# Patient Record
Sex: Male | Born: 1960 | Marital: Married | Smoking: Former smoker
Health system: Northeastern US, Community
[De-identification: ages and names within clinical notes are randomized; demographics above are authoritative.]

## PROBLEM LIST (undated history)

## (undated) DIAGNOSIS — F524 Premature ejaculation: Secondary | ICD-10-CM

## (undated) DIAGNOSIS — M25569 Pain in unspecified knee: Secondary | ICD-10-CM

## (undated) DIAGNOSIS — I119 Hypertensive heart disease without heart failure: Secondary | ICD-10-CM

## (undated) HISTORY — DX: Pain in unspecified knee: M25.569

## (undated) HISTORY — DX: Premature ejaculation: F52.4

## (undated) HISTORY — DX: Hypertensive heart disease without heart failure: I11.9

---

## 2004-03-02 ENCOUNTER — Encounter: Payer: Self-pay | Admitting: Internal Medicine

## 2004-03-12 ENCOUNTER — Ambulatory Visit: Payer: Self-pay | Admitting: Internal Medicine

## 2004-03-12 ENCOUNTER — Other Ambulatory Visit: Payer: Self-pay

## 2004-03-12 DIAGNOSIS — D1620 Benign neoplasm of long bones of unspecified lower limb: Principal | ICD-10-CM

## 2004-03-12 DIAGNOSIS — M25469 Effusion, unspecified knee: Secondary | ICD-10-CM

## 2004-03-12 LAB — BASIC METABOLIC PANEL
BUN (UREA NITROGEN): 23 mg/dl — ABNORMAL HIGH (ref 10–20)
CALCIUM: 9.1 mg/dl (ref 8.5–10.5)
CARBON DIOXIDE: 29 mEQ/L (ref 22–32)
CHLORIDE: 110 mEQ/L (ref 98–110)
CREATININE: 1 mg/dl (ref 0.8–1.4)
Glucose Random: 91 mg/dl (ref 65–160)
POTASSIUM: 3.8 mEQ/L (ref 3.5–5.0)
SODIUM: 141 mEQ/L (ref 135–145)

## 2004-03-12 LAB — BLOOD COUNT COMPLETE AUTOMATED
HEMATOCRIT: 44.4 % (ref 42.0–52.0)
HEMOGLOBIN: 15.4 g/dL (ref 14.0–18.0)
MEAN CORP HGB CONC: 34.8 g/dL (ref 32.0–36.0)
MEAN CORPUSCULAR HGB: 30.6 pg (ref 27.0–31.0)
MEAN CORPUSCULAR VOL: 88 fL (ref 80.0–94.0)
MEAN PLATELET VOLUME: 8.6 fL (ref 6.4–10.8)
PLATELET COUNT: 223 10*3/uL (ref 150–400)
RBC DISTRIBUTION WIDTH: 13.1 % (ref 11.5–14.3)
RED BLOOD CELL COUNT: 5.05 MIL/uL (ref 4.70–6.10)
WHITE BLOOD CELL COUNT: 4.2 10*3/uL — ABNORMAL LOW (ref 4.8–10.8)

## 2004-03-12 LAB — URINALYSIS
BACTERIA: <10 PER HPF — AB (ref 0–?)
BILIRUBIN, URINE: NEGATIVE
CRYSTALS: NONE SEEN
GLUCOSE, URINE: NEGATIVE MG/DL
KETONE, URINE: 15 MG/DL — AB
LEUKOCYTE ESTERASE: NEGATIVE
NITRITE, URINE: NEGATIVE
PH URINE: 5.5 (ref 5.0–8.0)
PROTEIN, URINE: NEGATIVE MG/DL
SPECIFIC GRAVITY URINE: 1.03 (ref 1.003–1.035)

## 2004-03-12 LAB — CHG LIPOPROTEIN DIRECT MEASUREMENT LDL CHOLESTEROL: LOW DENSITY LIPOPROTEIN DIRECT: 111 mg/dl (ref ?–130)

## 2004-03-12 LAB — CHOLESTEROL SERUM/WHOLE BLOOD TOTAL: Cholesterol: 168 mg/dl (ref 0–200)

## 2004-03-12 LAB — PROSTATIC SPECIFIC ANTIGEN: PROSTATIC SPECIFIC ANTIGEN: 0.2 ng/ml (ref 0.0–4.0)

## 2004-03-12 LAB — CHG LIPOPROTEIN DIR MEAS HIGH DENSITY CHOLESTEROL: HIGH DENSITY LIPOPROTEIN: 52 mg/dl (ref 35–95)

## 2004-03-13 LAB — XR KNEE LEFT 3 VIEWS

## 2004-03-24 ENCOUNTER — Ambulatory Visit (HOSPITAL_BASED_OUTPATIENT_CLINIC_OR_DEPARTMENT_OTHER): Payer: PRIVATE HEALTH INSURANCE | Admitting: Rehabilitative and Restorative Service Providers"

## 2004-03-31 ENCOUNTER — Ambulatory Visit (HOSPITAL_BASED_OUTPATIENT_CLINIC_OR_DEPARTMENT_OTHER): Payer: PRIVATE HEALTH INSURANCE | Admitting: Rehabilitative and Restorative Service Providers"

## 2004-03-31 DIAGNOSIS — M2390 Unspecified internal derangement of unspecified knee: Principal | ICD-10-CM

## 2004-04-10 ENCOUNTER — Ambulatory Visit (HOSPITAL_BASED_OUTPATIENT_CLINIC_OR_DEPARTMENT_OTHER): Payer: PRIVATE HEALTH INSURANCE | Admitting: Rehabilitative and Restorative Service Providers"

## 2004-04-14 ENCOUNTER — Ambulatory Visit (HOSPITAL_BASED_OUTPATIENT_CLINIC_OR_DEPARTMENT_OTHER): Payer: PRIVATE HEALTH INSURANCE | Admitting: Rehabilitative and Restorative Service Providers"

## 2004-04-14 DIAGNOSIS — M2390 Unspecified internal derangement of unspecified knee: Principal | ICD-10-CM

## 2004-04-22 ENCOUNTER — Ambulatory Visit (HOSPITAL_BASED_OUTPATIENT_CLINIC_OR_DEPARTMENT_OTHER): Payer: PRIVATE HEALTH INSURANCE | Admitting: Rehabilitative and Restorative Service Providers"

## 2004-04-27 ENCOUNTER — Ambulatory Visit (HOSPITAL_BASED_OUTPATIENT_CLINIC_OR_DEPARTMENT_OTHER): Payer: Self-pay | Admitting: Internal Medicine

## 2004-07-30 ENCOUNTER — Ambulatory Visit: Payer: Self-pay | Admitting: Internal Medicine

## 2004-09-09 ENCOUNTER — Encounter (HOSPITAL_BASED_OUTPATIENT_CLINIC_OR_DEPARTMENT_OTHER): Payer: Charity

## 2004-09-09 ENCOUNTER — Encounter (HOSPITAL_BASED_OUTPATIENT_CLINIC_OR_DEPARTMENT_OTHER): Payer: Self-pay

## 2004-09-22 ENCOUNTER — Encounter (HOSPITAL_BASED_OUTPATIENT_CLINIC_OR_DEPARTMENT_OTHER): Payer: Charity | Admitting: Psychiatry

## 2004-09-22 ENCOUNTER — Encounter (HOSPITAL_BASED_OUTPATIENT_CLINIC_OR_DEPARTMENT_OTHER): Payer: Self-pay | Admitting: Psychiatry

## 2004-10-29 ENCOUNTER — Ambulatory Visit (HOSPITAL_BASED_OUTPATIENT_CLINIC_OR_DEPARTMENT_OTHER): Payer: Self-pay | Admitting: Internal Medicine

## 2004-11-05 ENCOUNTER — Ambulatory Visit (HOSPITAL_BASED_OUTPATIENT_CLINIC_OR_DEPARTMENT_OTHER): Payer: Charity | Admitting: Internal Medicine

## 2004-11-05 ENCOUNTER — Encounter (HOSPITAL_BASED_OUTPATIENT_CLINIC_OR_DEPARTMENT_OTHER): Payer: Self-pay | Admitting: Internal Medicine

## 2004-11-05 VITALS — BP 140/80 | HR 78 | Temp 98.4°F | Resp 18 | Wt 153.0 lb

## 2004-11-05 DIAGNOSIS — M25569 Pain in unspecified knee: Secondary | ICD-10-CM | POA: Insufficient documentation

## 2004-11-05 DIAGNOSIS — F524 Premature ejaculation: Secondary | ICD-10-CM

## 2004-11-05 DIAGNOSIS — I119 Hypertensive heart disease without heart failure: Principal | ICD-10-CM

## 2004-11-05 DIAGNOSIS — I10 Essential (primary) hypertension: Secondary | ICD-10-CM | POA: Insufficient documentation

## 2004-11-05 DIAGNOSIS — B360 Pityriasis versicolor: Secondary | ICD-10-CM

## 2004-11-05 HISTORY — DX: Hypertensive heart disease without heart failure: I11.9

## 2004-11-05 HISTORY — DX: Premature ejaculation: F52.4

## 2004-11-05 HISTORY — DX: Pain in unspecified knee: M25.569

## 2004-11-05 MED ORDER — ATENOLOL 50 MG PO TABS
ORAL_TABLET | ORAL | Status: DC
Start: 2004-11-05 — End: 2006-01-05

## 2004-11-05 MED ORDER — SELSUN 2.5 % EX LOTN
TOPICAL_LOTION | CUTANEOUS | Status: AC
Start: 2004-11-05 — End: 2005-11-05

## 2004-11-05 MED ORDER — FLUOXETINE HCL 20 MG PO CAPS
ORAL_CAPSULE | ORAL | Status: AC
Start: 2004-11-05 — End: 2005-11-05

## 2004-11-05 NOTE — Progress Notes (Signed)
Jonathan Cole is a 43 year old male seen with an interpreter who is here to f/u for htn, premature ejaculation, and now c/o a rash on his back for weeks. It is not pruritic or painful. He still has marital difficulty and is seeking counseling. An appointment has been made. He denies HA, change in vision.    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  outpatient prescriptions as of 11/05/04:  ATENOLOL 50 MG OR TABS,1 TABLET DAILY,Disp: 30,Rfl: 5  FLUOXETINE HCL 20 MG OR CAPS,1 CAPSULE EVERY MORNING,Disp: 30,Rfl: 5  SELSUN 2.5 % EX LOTN,Apply daily for seven days and repeat monthly for three months,Disp: 120cc,Rfl: 1  SELSUN 2.5 % EX LOTN,Apply daily for 7 days and monthly for 3 months,Disp: 120cc,Rfl: 1    Social History   Marital Status: Single Spouse Name:    Years of Education: Number of children: 1     Occupational History  Occupation Research scientist (life sciences)   busboy     Social History Main Topics   Tobacco Use: Never    Alcohol Use: No    Drug Use: No   Sexually Active: Unsatisfactory    PE: BP 140/80   Pulse 78   Temp 98.4   Resp 18   Wt 153 lbs (69.4kg)  HEENT: neg  Chest: neg  CV: neg  Abd: neg  Ext: crepitance left knee    IMP and PLAN:  402.10 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

## 2005-05-24 ENCOUNTER — Ambulatory Visit (HOSPITAL_BASED_OUTPATIENT_CLINIC_OR_DEPARTMENT_OTHER): Payer: Self-pay | Admitting: Internal Medicine

## 2006-01-05 ENCOUNTER — Encounter (HOSPITAL_BASED_OUTPATIENT_CLINIC_OR_DEPARTMENT_OTHER): Payer: Self-pay | Admitting: Internal Medicine

## 2006-01-05 ENCOUNTER — Ambulatory Visit (HOSPITAL_BASED_OUTPATIENT_CLINIC_OR_DEPARTMENT_OTHER): Payer: Medicaid Other | Admitting: Internal Medicine

## 2006-01-05 ENCOUNTER — Ambulatory Visit (HOSPITAL_BASED_OUTPATIENT_CLINIC_OR_DEPARTMENT_OTHER): Payer: Self-pay | Admitting: Internal Medicine

## 2006-01-05 VITALS — BP 138/80 | HR 74 | Temp 98.6°F | Resp 18 | Wt 160.0 lb

## 2006-01-05 DIAGNOSIS — Z23 Encounter for immunization: Secondary | ICD-10-CM

## 2006-01-05 DIAGNOSIS — I119 Hypertensive heart disease without heart failure: Principal | ICD-10-CM

## 2006-01-05 MED ORDER — ATENOLOL 50 MG PO TABS
ORAL_TABLET | ORAL | Status: DC
Start: 2006-01-05 — End: 2006-09-30

## 2006-01-05 NOTE — Nursing Note (Signed)
>>   MOLLOMO-TERRY, DENISE     01/05/2006   4:15 pm  Td vaccine given left deltoid . Vis given . Allergies reviewed .

## 2006-01-05 NOTE — Progress Notes (Signed)
Jonathan Cole is a 45 year old male who returns for BP check. He is still receiving atenolol from the [facility] pharmacy even though the prescription seems to have expired in Epic. He wants to know whether he still needs the BP meds. He denies dyspnea, headache, chest pain.     The patient stopped fluoxetine because it didn't help the premature ejaculation. I counseled him in the use of desensitizing lotions.     ROS: GI neg; GU above; Resp neg; CV neg.    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  No current outpatient prescriptions on file prior to 01/05/06.    Social History   Marital Status: Married Spouse Name:    Years of Education: Number of children: 1     Occupational History  Occupation Administrator, sports OTHER     Social History Main Topics   Tobacco Use: Never    Alcohol Use: No    Drug Use: Not on file    Sexual Activity: Yes Partners with: Male    Social history and FH updated at this visit.    PE:BP 138/80   Pulse 74   Temp (Src) 98.6 (Oral)   Resp 18   Wt 160 lbs (72.6kg)  Chest: normal breath sounds  CV: no murmurs or gallops  Abd: neg  Ext: no edema  Neuro: normal gait and speech. Affect normal.    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 Patient apprised that he still needed BP meds.    V06.5 VACCINE FOR TETANUS/DIPHTERIA  Plan: IMMUNIZATION ADMIN SINGLE, RN, TD VACCINE > 7,    IM

## 2006-09-30 ENCOUNTER — Other Ambulatory Visit (HOSPITAL_BASED_OUTPATIENT_CLINIC_OR_DEPARTMENT_OTHER): Payer: Self-pay

## 2006-09-30 DIAGNOSIS — I119 Hypertensive heart disease without heart failure: Principal | ICD-10-CM

## 2006-09-30 MED ORDER — ATENOLOL 50 MG PO TABS
ORAL_TABLET | ORAL | Status: DC
Start: 2006-09-30 — End: 2007-02-01

## 2006-09-30 NOTE — Telephone Encounter (Signed)
Staff Message copied by Sharlyne Pacas on 09/30/2006 at 11:33 AM  ------   Message from: Tanna Savoy   Created: 09/30/2006 at 11:28 AM   Regarding: [facility] FREE CARE    Jonathan Cole [id number], 46 year old, male, Telephone Information:  Home Phone [phone]  Work Phone [phone]  Mobile [phone]      Jonathan Cole NUMBER: [phone]  Cell phone:   Other phone:    Available times:    Patient's language of care: [place]    Patient needs a [place] interpreter.    Patient's PCP: Ilda Mori, MD, MD    Person calling on behalf of patient: patient (self)    Calls today for med refill(s).HE HAS APPT ON12/05/07 BUT NEEDS ATENOLOL 50 MG    Patient's Preferred Pharmacy:   No Pharmacies Listed

## 2006-09-30 NOTE — Telephone Encounter (Signed)
Patient needs today.is going to see Jonathan Cole.to dr Kathlene November to sign off on dt,rn

## 2006-11-02 ENCOUNTER — Ambulatory Visit (HOSPITAL_BASED_OUTPATIENT_CLINIC_OR_DEPARTMENT_OTHER): Payer: Medicaid Other | Admitting: Internal Medicine

## 2006-11-02 VITALS — BP 174/90 | HR 52 | Temp 98.3°F | Ht 66.75 in | Wt 166.0 lb

## 2006-11-02 DIAGNOSIS — I119 Hypertensive heart disease without heart failure: Principal | ICD-10-CM

## 2006-11-02 DIAGNOSIS — F524 Premature ejaculation: Secondary | ICD-10-CM

## 2006-11-02 MED ORDER — HYDROCHLOROTHIAZIDE 25 MG PO TABS
ORAL_TABLET | ORAL | Status: DC
Start: 2006-11-02 — End: 2006-12-28

## 2006-11-02 MED ORDER — HYDROCHLOROTHIAZIDE 25 MG PO TABS
ORAL_TABLET | ORAL | Status: DC
Start: 2006-11-02 — End: 2006-11-02

## 2006-11-02 NOTE — Progress Notes (Signed)
Subjective:   Jonathan Cole is a 45 year old male with hypertension.  Current outpatient prescriptions:  ATENOLOL 50 MG OR TABS, 1 TABLET DAILY, Disp: 30, Rfl: 5     Hypertension ROS: taking medications( Atenolol) as instructed, side effects noted by patient include fatigue, no TIA's, no chest pain on exertion, no dyspnea on exertion, no swelling of ankles.   New concerns: Fatigue.   Review of other systems was contributory for a longstaning problems with premature ejaculation.     Objective:   BP 174/90 repeated by me 145/80  Pulse 52  Temp (Src) 98.3 (Oral)  Ht 5' 6.75" (1.17m)  Wt 166 lbs (75.3kg)   Appearance healthy, alert and cooperative.  General exam BP noted to be mildly elevated today in office, S1, S2 normal, no gallop, no murmur, chest clear, no JVD, no HSM, no edema, fundi - normal, CVS exam - S1, S2 normal, no murmur, click, rub or gallop, regular rate and rhythm, no hepatosplenomegaly.   Lab review: labs are reviewed, up to date and normal.     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 Will switch to a thiazide in line with the JNC guidelines. His only BMP in the system showed a K+ of 3.8.   Will start HCTZ 25mg  po daily and check a potassium level in 2 weeks. Patient advised to stop taking atenolol. reviewed diet, exercise and weight control, reviewed medications and side effects in detail.  Plan: BASIC METABOLIC PANEL, HYDROCHLOROTHIAZIDE 25    MG OR TABS       302.75 PREMATURE EJACULATION  Note: No change in his symptoms despite using some ointments( ?? Prescribed by previous PCP)   Plan: Will address this on his next visit.     Above history, physical exam finding and plan discussed with Dr. Greer Ee.

## 2006-11-09 NOTE — Progress Notes (Signed)
PRECEPTOR NOTE  On the day of the patient's visit, I personally saw and evaluated the patient. In addition, I reviewed findings with the resident. I confirm the key elements of history and physical exam as described in resident's note.  I agree with the assessment and plan as described below.  Please see resident's note for further details.

## 2006-12-15 ENCOUNTER — Encounter (HOSPITAL_BASED_OUTPATIENT_CLINIC_OR_DEPARTMENT_OTHER): Payer: Medicaid Other | Admitting: Internal Medicine

## 2006-12-28 ENCOUNTER — Other Ambulatory Visit (HOSPITAL_BASED_OUTPATIENT_CLINIC_OR_DEPARTMENT_OTHER): Payer: Self-pay

## 2006-12-28 NOTE — Telephone Encounter (Signed)
Staff Message copied by Sharlyne Pacas on 12/28/2006 at 9:24 AM  ------   Message from: Eduardo Osier   Created: 12/28/2006 at 9:21 AM   Regarding: RXR   Contact: [phone]    Daiquan Senerchia [id number], 46 year old, male, Telephone Information:  Home Phone [phone]  Work Phone [phone]  Mobile [phone]      Cleotis Lema NUMBER: [phone]    Cell phone:   Other phone:    Available times:    Patient's language of care: [place]    Patient needs a [place] interpreter.    Patient's PCP: Jude Koomson, MD    Person calling on behalf of patient: patient (self)    Calls today for med refill(s). HYDROCHLOROTHIAZIDE 25 MG     Patient's Preferred Pharmacy:    [facility]  Phone: [phone] Fax: [phone]

## 2007-01-03 MED ORDER — HYDROCHLOROTHIAZIDE 25 MG PO TABS
ORAL_TABLET | ORAL | Status: DC
Start: 2006-12-28 — End: 2007-01-05

## 2007-01-05 ENCOUNTER — Other Ambulatory Visit (HOSPITAL_BASED_OUTPATIENT_CLINIC_OR_DEPARTMENT_OTHER): Payer: Self-pay

## 2007-01-05 NOTE — Telephone Encounter (Signed)
Staff Message copied by Sharlyne Pacas on 01/05/2007 at 1:23 PM  ------   Message from: Tanna Savoy   Created: 01/05/2007 at 1:19 PM   Regarding: [facility] FREE CARE    Edge Katzman [id number], 46 year old, male, Telephone Information:  Home Phone [phone]  Work Phone [phone]  Mobile [phone]      Cleotis Lema NUMBER: [phone]  Cell phone:   Other phone:    Available times:    Patient's language of care: [place]    Patient needs a [place] interpreter.    Patient's PCP: Jude Koomson, MD    Person calling on behalf of patient: patient (self)    Calls today for med refill(s).HE NEEDS REFILL ON HYDROCHLOROTHIAZIDE 25MG     Patient's Preferred Pharmacy:   [facility]  Phone: [phone] Fax: [phone]

## 2007-01-06 MED ORDER — HYDROCHLOROTHIAZIDE 25 MG PO TABS
ORAL_TABLET | ORAL | Status: DC
Start: 2007-01-05 — End: 2007-02-01

## 2007-01-06 NOTE — Telephone Encounter (Signed)
To attending md to sign,not done.dt,rn

## 2007-02-01 ENCOUNTER — Ambulatory Visit (HOSPITAL_BASED_OUTPATIENT_CLINIC_OR_DEPARTMENT_OTHER): Payer: PRIVATE HEALTH INSURANCE | Admitting: Internal Medicine

## 2007-02-01 VITALS — BP 130/84 | HR 76 | Temp 98.8°F | Ht 66.5 in | Wt 165.0 lb

## 2007-02-01 DIAGNOSIS — F524 Premature ejaculation: Secondary | ICD-10-CM

## 2007-02-01 DIAGNOSIS — I119 Hypertensive heart disease without heart failure: Principal | ICD-10-CM

## 2007-02-01 DIAGNOSIS — R51 Headache: Secondary | ICD-10-CM

## 2007-02-01 LAB — BASIC METABOLIC PANEL
ANION GAP: 6 mmol/L (ref 2–25)
BUN (UREA NITROGEN): 17 mg/dl (ref 6–20)
CALCIUM: 9.7 mg/dl (ref 8.6–10.0)
CARBON DIOXIDE: 31 mmol/L (ref 22–32)
CHLORIDE: 102 mmol/L (ref 101–111)
CREATININE: 1 mg/dl (ref 0.7–1.2)
Glucose Random: 80 mg/dl (ref 74–160)
POTASSIUM: 3.7 mmol/L (ref 3.5–5.1)
SODIUM: 139 mmol/L (ref 135–144)

## 2007-02-01 MED ORDER — HYDROCHLOROTHIAZIDE 25 MG PO TABS
ORAL_TABLET | ORAL | Status: DC
Start: 2007-02-01 — End: 2007-12-06

## 2007-02-01 NOTE — Progress Notes (Signed)
Entire visit/phone conversation conducted with assistance of medical interpreter,Marcos Pienasola.    Issues reviewed today:  # HTN - pt notes he switched from atenolol to HCTZ  feels less sleepy now too  now feels like he has early morning headaches when he did not have before  - denies any significant coffee use  - denies any change in ability to sleep   - today no pain but feels heaviness in his head    - no chest pains or pressure and no dyspnea    # HAs  - thinks they area associated iwth his medications (HCTZ)  - occurs when he wakes up   - during Mondays and Tuesdays mostly - not everyday  - located in back of head/ from neck  - denies any associated vision change, nausea/vomiting, paresthesias, weakness  - denies any pains shooting from neck to arms/ hands  - has not tried any meds for these at all   - pt wonders if it related to lack of exercise    # Joints pains - genearlized joint pains - no specific joint problems - wonders if due to old age    # Premature Ejaculation   - pt notes he tried med before (looks as if it was Prozac per EPIC) but did not feel it was helpful  - denies any marital problems   - denies any problems urinating or with erectiosn - only problem is ejaculation too fast    Patient Active Problem List:   PREMATURE EJACULATION [302.75]   Date Noted: 11/05/2004   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 [402.10]   Date Noted: 11/05/2004   JOINT PAIN-L/LEG [719.46]   Date Noted: 11/05/2004   Comment: DJD Left knee    Current outpatient prescriptions prior to 02/01/07:  HYDROCHLOROTHIAZIDE 25 MG OR TABS, 1 TABLET DAILY, Disp: 30, Rfl: 5    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 posture is poor.   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 cont current med as it seems unlikely that HCTZ cuasing headaches but will be mindful.  Plan: BASIC METABOLIC PANEL, ROUTINE VENIPUNCTURE,    HYDROCHLOROTHIAZIDE 25 MG OR TABS refilled   rtc in 6 months or prn earlier.     784.0 HEADACHE  Note: neuro exam nl. ? if due to neck strain/ poor posture.   Plan: Advised tylenol up to 1000mg  every 8 hours as needed.   Asked him to call me if  his headaches get worse or are more frequent. Pt will try to do more exercise.     302.75 PREMATURE EJACULATION  Note: pt reports no help with Prozac before.  Plan: will review and see if any alternatives available to help him and call him back.     Rev'd above topic - perhaps Paxil will be of more assistance. Will call back and offer this trial.

## 2007-02-08 ENCOUNTER — Telehealth (HOSPITAL_BASED_OUTPATIENT_CLINIC_OR_DEPARTMENT_OTHER): Payer: Self-pay | Admitting: Internal Medicine

## 2007-02-08 DIAGNOSIS — F524 Premature ejaculation: Principal | ICD-10-CM

## 2007-02-08 MED ORDER — PAROXETINE HCL 10 MG PO TABS
ORAL_TABLET | ORAL | Status: DC
Start: 2007-02-08 — End: 2007-11-15

## 2007-02-08 NOTE — Telephone Encounter (Signed)
D/w pt w/ help of [place] interpreter use of Paxil for premature ejaculation. Pt will try this now and notify me if not helpful or if any unusual side effects. Of note, he tolerated Prozac well and had no side effects - it just did not help his sexual dysfxn.

## 2007-11-15 ENCOUNTER — Encounter (HOSPITAL_BASED_OUTPATIENT_CLINIC_OR_DEPARTMENT_OTHER): Payer: Self-pay | Admitting: Internal Medicine

## 2007-11-15 ENCOUNTER — Telehealth (HOSPITAL_BASED_OUTPATIENT_CLINIC_OR_DEPARTMENT_OTHER): Payer: Self-pay | Admitting: Internal Medicine

## 2007-11-15 ENCOUNTER — Ambulatory Visit (HOSPITAL_BASED_OUTPATIENT_CLINIC_OR_DEPARTMENT_OTHER): Payer: No Typology Code available for payment source | Admitting: Internal Medicine

## 2007-11-15 VITALS — BP 138/92 | HR 66 | Temp 97.5°F | Ht 67.72 in | Wt 169.0 lb

## 2007-11-15 DIAGNOSIS — R51 Headache: Secondary | ICD-10-CM

## 2007-11-15 DIAGNOSIS — F524 Premature ejaculation: Secondary | ICD-10-CM

## 2007-11-15 DIAGNOSIS — I119 Hypertensive heart disease without heart failure: Principal | ICD-10-CM

## 2007-11-15 DIAGNOSIS — Z5689 Other problems related to employment: Secondary | ICD-10-CM

## 2007-11-15 DIAGNOSIS — R519 Headache, unspecified: Secondary | ICD-10-CM

## 2007-11-15 LAB — MICROALBUMIN RANDOM URINE
ALB/CREAT RATIO URINE RAN: 18 ug/mg (ref 0–30)
ALBUMIN URINE RANDOM: 1.7 mg/dl (ref 0.0–1.9)
CREATININE RANDOM URINE: 95 mg/dl

## 2007-11-15 MED ORDER — ATENOLOL 50 MG PO TABS
ORAL_TABLET | ORAL | Status: DC
Start: 2007-11-15 — End: 2007-12-06

## 2007-11-15 MED ORDER — PAROXETINE HCL 10 MG PO TABS
ORAL_TABLET | ORAL | Status: DC
Start: 2007-11-15 — End: 2008-01-17

## 2007-11-17 LAB — CHG LIPID PANEL
Cholesterol: 189 mg/dl (ref 0–200)
HIGH DENSITY LIPOPROTEIN: 47 mg/dl (ref 29–71)
LOW DENSITY LIPOPROTEIN DIRECT: 138 mg/dl — ABNORMAL HIGH (ref 0–100)
RISK FACTOR: 4 (ref ?–5.0)
TRIGLYCERIDES: 37 mg/dl (ref 0–150)

## 2007-11-17 LAB — BASIC METABOLIC PANEL FASTING
ANION GAP: 8 mmol/L (ref 2–25)
BUN (UREA NITROGEN): 21 mg/dl — ABNORMAL HIGH (ref 6–20)
CALCIUM: 9.5 mg/dl (ref 8.6–10.0)
CARBON DIOXIDE: 29 mmol/L (ref 22–32)
CHLORIDE: 102 mmol/L (ref 101–111)
CREATININE: 1 mg/dl (ref 0.7–1.2)
ESTIMATED GLOMERULAR FILT RATE: >60 mL/min (ref 60–116)
GLUCOSE FASTING: 86 mg/dl (ref 74–118)
POTASSIUM: 3.7 mmol/L (ref 3.5–5.1)
SODIUM: 139 mmol/L (ref 135–144)

## 2007-11-17 LAB — HEPATITIS B SURFACE ANTIBODY: HEPATITIS B SURFACE ANTIBODY: NONREACTIVE

## 2007-11-20 ENCOUNTER — Encounter (HOSPITAL_BASED_OUTPATIENT_CLINIC_OR_DEPARTMENT_OTHER): Payer: Self-pay | Admitting: Internal Medicine

## 2007-11-22 ENCOUNTER — Ambulatory Visit: Payer: Self-pay | Admitting: Internal Medicine

## 2007-11-22 LAB — CT HEAD W & WO CONTRAST

## 2007-12-01 ENCOUNTER — Ambulatory Visit (HOSPITAL_BASED_OUTPATIENT_CLINIC_OR_DEPARTMENT_OTHER): Payer: Self-pay | Admitting: Internal Medicine

## 2007-12-06 ENCOUNTER — Ambulatory Visit (HOSPITAL_BASED_OUTPATIENT_CLINIC_OR_DEPARTMENT_OTHER): Payer: No Typology Code available for payment source | Admitting: Internal Medicine

## 2007-12-06 ENCOUNTER — Encounter (HOSPITAL_BASED_OUTPATIENT_CLINIC_OR_DEPARTMENT_OTHER): Payer: Self-pay | Admitting: Internal Medicine

## 2007-12-06 VITALS — BP 156/80 | HR 68 | Temp 98.0°F | Ht 66.25 in | Wt 169.0 lb

## 2007-12-06 DIAGNOSIS — Z23 Encounter for immunization: Secondary | ICD-10-CM

## 2007-12-06 DIAGNOSIS — G9389 Other specified disorders of brain: Secondary | ICD-10-CM

## 2007-12-06 DIAGNOSIS — R519 Headache, unspecified: Secondary | ICD-10-CM

## 2007-12-06 DIAGNOSIS — I119 Hypertensive heart disease without heart failure: Principal | ICD-10-CM

## 2007-12-06 DIAGNOSIS — R51 Headache: Secondary | ICD-10-CM

## 2007-12-06 DIAGNOSIS — R9431 Abnormal electrocardiogram [ECG] [EKG]: Secondary | ICD-10-CM

## 2007-12-06 MED ORDER — HYDROCHLOROTHIAZIDE 25 MG PO TABS
ORAL_TABLET | ORAL | Status: DC
Start: 2007-12-06 — End: 2008-01-17

## 2007-12-06 MED ORDER — LISINOPRIL 10 MG PO TABS
ORAL_TABLET | ORAL | Status: DC
Start: 2007-12-06 — End: 2008-08-28

## 2007-12-08 ENCOUNTER — Encounter (HOSPITAL_BASED_OUTPATIENT_CLINIC_OR_DEPARTMENT_OTHER): Payer: Self-pay | Admitting: Internal Medicine

## 2007-12-08 DIAGNOSIS — R9431 Abnormal electrocardiogram [ECG] [EKG]: Secondary | ICD-10-CM | POA: Insufficient documentation

## 2007-12-08 DIAGNOSIS — G9389 Other specified disorders of brain: Secondary | ICD-10-CM | POA: Insufficient documentation

## 2007-12-13 ENCOUNTER — Other Ambulatory Visit (HOSPITAL_BASED_OUTPATIENT_CLINIC_OR_DEPARTMENT_OTHER): Payer: Self-pay | Admitting: Internal Medicine

## 2007-12-13 DIAGNOSIS — G9389 Other specified disorders of brain: Principal | ICD-10-CM

## 2007-12-13 NOTE — Progress Notes (Signed)
per lab receiving must use "miscellaneous" order for the T.solium test, not "hold tube" order.  she said that "hold tube" orders are not used anymore actually.     reviewed test in [employer].  Copied information into lab order.

## 2007-12-14 LAB — EKG

## 2007-12-20 ENCOUNTER — Ambulatory Visit (HOSPITAL_BASED_OUTPATIENT_CLINIC_OR_DEPARTMENT_OTHER): Payer: Self-pay | Admitting: Internal Medicine

## 2007-12-20 ENCOUNTER — Ambulatory Visit (HOSPITAL_BASED_OUTPATIENT_CLINIC_OR_DEPARTMENT_OTHER): Payer: No Typology Code available for payment source | Admitting: Ambulatory Care

## 2007-12-20 DIAGNOSIS — G9389 Other specified disorders of brain: Principal | ICD-10-CM

## 2007-12-20 DIAGNOSIS — R9431 Abnormal electrocardiogram [ECG] [EKG]: Secondary | ICD-10-CM

## 2007-12-20 DIAGNOSIS — I119 Hypertensive heart disease without heart failure: Secondary | ICD-10-CM

## 2007-12-20 LAB — BASIC METABOLIC PANEL
ANION GAP: 8 mmol/L (ref 2–25)
BUN (UREA NITROGEN): 20 mg/dl (ref 6–20)
CALCIUM: 9.3 mg/dl (ref 8.6–10.0)
CARBON DIOXIDE: 26 mmol/L (ref 22–32)
CHLORIDE: 105 mmol/L (ref 101–111)
CREATININE: 1.1 mg/dl (ref 0.7–1.2)
ESTIMATED GLOMERULAR FILT RATE: >60 mL/min (ref 60–116)
Glucose Random: 96 mg/dl (ref 74–160)
POTASSIUM: 3.7 mmol/L (ref 3.5–5.1)
SODIUM: 139 mmol/L (ref 135–144)

## 2008-01-01 LAB — ECHOCARDIOGRAM W/ DOPPLER

## 2008-01-02 LAB — MISCELLANEOUS REFERENCE BLOOD

## 2008-01-09 ENCOUNTER — Encounter (HOSPITAL_BASED_OUTPATIENT_CLINIC_OR_DEPARTMENT_OTHER): Payer: Self-pay | Admitting: Internal Medicine

## 2008-01-16 ENCOUNTER — Encounter (HOSPITAL_BASED_OUTPATIENT_CLINIC_OR_DEPARTMENT_OTHER): Payer: Self-pay | Admitting: Internal Medicine

## 2008-01-17 ENCOUNTER — Ambulatory Visit (HOSPITAL_BASED_OUTPATIENT_CLINIC_OR_DEPARTMENT_OTHER): Payer: No Typology Code available for payment source | Admitting: Internal Medicine

## 2008-01-17 VITALS — BP 140/78 | HR 66 | Temp 98.3°F | Ht 66.54 in | Wt 168.0 lb

## 2008-01-17 DIAGNOSIS — Z23 Encounter for immunization: Secondary | ICD-10-CM

## 2008-01-17 DIAGNOSIS — F524 Premature ejaculation: Secondary | ICD-10-CM

## 2008-01-17 DIAGNOSIS — I119 Hypertensive heart disease without heart failure: Principal | ICD-10-CM

## 2008-01-17 MED ORDER — PAROXETINE HCL 10 MG PO TABS
ORAL_TABLET | ORAL | Status: DC
Start: 2008-01-17 — End: 2008-08-28

## 2008-01-17 NOTE — Progress Notes (Signed)
Entire visit/phone conversation conducted with assistance of medical interpreter,Jonathan Cole.     Issues discussed:  # Headaches are gone now.  He has not had any in 3 weeks.     # BP  - taking both lisinopril and HCTZ still.  Not checking at home.   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     Review of Patient's Allergies indicates:  No Known Allergies.     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  rtc in 6 months.     302.75 Premature Ejaculation  Comment: refilled meds  Plan: PAROXETINE HCL 10 MG OR TABS            V05.3J Vaccine for Viral Hepatitis  Comment: 2nd dose needed and advised pt that if he were to get a needlestick injury at work he should go to the ER for prophylaxis.  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    Plan: HEP B VACCINE, ADULT, IM, IMMUNIZATION ADMIN         SINGLE, RN        rtc for 3 rd vaccine in 6 months when comes for BP  check    also reviewed that if he were to have more headaches again, I want him to call me to review what his sxs are/ re-eval.

## 2008-01-17 NOTE — Progress Notes (Signed)
Patient ID'd with name and DOB. No prbs with first dose hep B vaccine, dose #2 given as ordered.

## 2008-01-25 ENCOUNTER — Encounter (HOSPITAL_BASED_OUTPATIENT_CLINIC_OR_DEPARTMENT_OTHER): Payer: Self-pay | Admitting: Internal Medicine

## 2008-01-30 ENCOUNTER — Encounter (HOSPITAL_BASED_OUTPATIENT_CLINIC_OR_DEPARTMENT_OTHER): Payer: Self-pay

## 2008-01-30 NOTE — Progress Notes (Signed)
DPH-CCP-CHART REVIEW    Breast Cancer   Clinical Breast Exam result: Does not apply       Mammography BI-RADS result: Does not apply      Cervical Cancer Screening   PAP Test Result: Does not apply     Cardiovascular Risk Screening   High Risk Finding: None of the Above/Not High Risk                Elevated Risk:  Body Mass Index (BMI) greater than or equal to 25-29      Prostate Cancer Screening   Digital Rectal Exam (DRE): Does not apply                  Prostate Specific Antigen (PSA): No PSA documented    Colon Cancer Screening   Colonoscopy results: Does not apply    Referrals    Referral to Risk Reduction/Health Educator  Referral to Registered Dietician    Back to patient navigator to coordinate and make sure patient gets to appts.  Added to pn and rre list.    RN Case Reviewer:  Sharlyne Pacas    Referred to Nurse Manager :  Gertie Baron [facility])

## 2008-05-30 ENCOUNTER — Encounter (HOSPITAL_BASED_OUTPATIENT_CLINIC_OR_DEPARTMENT_OTHER): Payer: Self-pay

## 2008-05-30 NOTE — Progress Notes (Signed)
LIFESTYLE ASSESSMENT AND ENROLLMENT FORMS ENTERED

## 2008-06-14 ENCOUNTER — Encounter (HOSPITAL_BASED_OUTPATIENT_CLINIC_OR_DEPARTMENT_OTHER): Payer: Self-pay | Admitting: Internal Medicine

## 2008-06-14 NOTE — Progress Notes (Signed)
DPH ATP SCORE ENTERED

## 2008-08-07 ENCOUNTER — Ambulatory Visit (HOSPITAL_BASED_OUTPATIENT_CLINIC_OR_DEPARTMENT_OTHER): Payer: No Typology Code available for payment source | Admitting: Internal Medicine

## 2008-08-28 ENCOUNTER — Encounter (HOSPITAL_BASED_OUTPATIENT_CLINIC_OR_DEPARTMENT_OTHER): Payer: Self-pay | Admitting: Internal Medicine

## 2008-08-28 ENCOUNTER — Ambulatory Visit (HOSPITAL_BASED_OUTPATIENT_CLINIC_OR_DEPARTMENT_OTHER): Payer: No Typology Code available for payment source | Admitting: Internal Medicine

## 2008-08-28 VITALS — BP 142/90 | HR 64 | Temp 98.1°F | Ht 67.0 in | Wt 172.0 lb

## 2008-08-28 DIAGNOSIS — I119 Hypertensive heart disease without heart failure: Principal | ICD-10-CM

## 2008-08-28 DIAGNOSIS — F524 Premature ejaculation: Secondary | ICD-10-CM

## 2008-08-28 DIAGNOSIS — B360 Pityriasis versicolor: Secondary | ICD-10-CM

## 2008-08-28 DIAGNOSIS — Z23 Encounter for immunization: Secondary | ICD-10-CM

## 2008-08-28 MED ORDER — KETOCONAZOLE (TOPICAL) 2 % EX SHAM
MEDICATED_SHAMPOO | CUTANEOUS | Status: AC
Start: 2008-08-28 — End: 2008-09-28

## 2008-08-28 MED ORDER — ATENOLOL 50 MG PO TABS
ORAL_TABLET | ORAL | Status: DC
Start: 2008-08-28 — End: 2009-06-11

## 2008-08-28 MED ORDER — PAROXETINE HCL 10 MG PO TABS
ORAL_TABLET | ORAL | Status: DC
Start: 2008-08-28 — End: 2009-06-11

## 2008-08-28 NOTE — Progress Notes (Signed)
Influenza Vaccine Procedure  August 28, 2008  Patient also overdue for hepatitis B #3, no problems with previous doses, given as ordered. Seen and ID'd with [place] interpreter Rosanna.    1. Has the patient received the information for the influenza vaccine? Yes    2. Does the patient have any of the following contraindications?  Allergy to eggs? No  Allergic reaction to previous influenza vaccines? No  Any other problems to previous influenza vaccines? No  Paralyzed by Guillain-Barre syndrome?  No  Current moderate or severe illness? No  Allergy to contact lens solution? No    3. The vaccine has been administered in the usual fashion and the patient/guardian was instructed to wait 20 minutes before leaving the building in the event of an allergic reaction:     Immunization information reviewed. Current VIS reviewed and given to patient/ guardian. Verbal assent obtained from patient/ guardian. Comfort measures for possible side effects reviewed. 08/28/2008  VIS given prior to administration and reviewed with the patient and or legal guardian. Patient understands the disease and the vaccine. See immunization/Injection module or chart review for date of publication and additional information.  Windell Moulding, RN

## 2008-08-28 NOTE — Progress Notes (Signed)
New pt to me -- formerly Dr. Zollie Scale  Cc: rash, HTN & premature ejaculation    HTN:  See EPIC Problem List for HPI.  He reports taking the hypertension medications as on his EPIC med list below.  He reports that he was originally on atenolol which did a good job controlling his BP but was stopped because he had some fatigue; HCTZ as well as lisinopril have been tried but his opinion is that these don't control his BP as well as the atenolol did   Also stopped taking his lisinopril 4 days ago when the pills ran out  He denies chest pain, dyspnea, headache, lower extremity edema or localized weakness or paresthesias  His last three blood pressures in clinic were:  Most Recent BP Reading(s)     Date:        BP:     08/28/2008   142/90     01/17/2008   140/78     12/20/2007   142/80  His Most Recent Weight Reading(s)     Date:        Wt:     08/28/2008   172 lb (78.019 kg)     01/17/2008   168 lb (76.204 kg)     12/06/2007   169 lb (76.658 kg)     11/15/2007   169 lb (76.658 kg)    last potassium was   POTASSIUM (mmol/L)   Date  Value    12/20/07  3.7    ----------  last creatinine was   CREATININE (mg/dl)   Date  Value    0/98/11  1.1    ----------    PREMATURE EJACULATION  He reports that this is the reason he's been on the paroxetine and it's been helping him somewhat with this problem and he wishes to continue  Denies any problem and anxiety or sadness    RASH  He notes a rash on his truck and back that has been persistent and stlightly worse for the past several months  Never tried any medications for this in the past  No fever, chills or other systemic symptoms      ROS: No fevers or unexplained weight loss. No new headaches. No shortness of breath or chest pain.   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 Regular rate and rhythm.   Lungs:  clear; no wheezes, rhonchi or rales.  Skin: slightly scaled, hypopigmented irregular patches on his chest and upper back     DATA:  His last cholesterol results are as follows:    LDL (mg/dl)   Date  Value    91/47/82  138*   ----------    HDL (mg/dl)   Date  Value    95/62/13  47    ----------    TRIGLYCERIDES (mg/dl)   Date  Value    08/65/78  37    ----------    ASSESSMENT & PLAN:  402.10 BENIGN HYP HRT DIS W/O  HRT FAIL  (primary encounter diagnosis)  Comment: BP not well controlled, not taking ACE inhibitor, prefers to switch to beta-blocker which is certainly reasonable -- restart atenolol   Plan: follow up in 6 weeks for BP check    302.75 Premature Ejaculation  Comment: since the paroxetine is working will continue but recommended that he uses this on a prn basis      111.0B Tinea Versicolor  Comment: rash consistent with tinea versicolor  Plan: we discussed proper use of ketoconazole in detail      follow-up will be scheduled for 6 weeks from now  he has been advised to call or return with any worsening or new problems

## 2008-10-09 ENCOUNTER — Ambulatory Visit (HOSPITAL_BASED_OUTPATIENT_CLINIC_OR_DEPARTMENT_OTHER): Payer: PRIVATE HEALTH INSURANCE | Admitting: Internal Medicine

## 2008-10-09 ENCOUNTER — Encounter (HOSPITAL_BASED_OUTPATIENT_CLINIC_OR_DEPARTMENT_OTHER): Payer: Self-pay | Admitting: Internal Medicine

## 2008-10-09 VITALS — BP 146/102 | HR 50 | Temp 97.6°F | Wt 170.0 lb

## 2008-10-09 DIAGNOSIS — B360 Pityriasis versicolor: Secondary | ICD-10-CM

## 2008-10-09 DIAGNOSIS — Z87891 Personal history of nicotine dependence: Secondary | ICD-10-CM

## 2008-10-09 DIAGNOSIS — Z0000 Encounter for general adult medical examination without abnormal findings: Principal | ICD-10-CM

## 2008-10-09 DIAGNOSIS — I10 Essential (primary) hypertension: Secondary | ICD-10-CM

## 2008-10-09 MED ORDER — CHLORTHALIDONE 25 MG PO TABS
ORAL_TABLET | ORAL | Status: DC
Start: 2008-10-09 — End: 2009-06-11

## 2008-10-09 NOTE — Progress Notes (Signed)
Cc: f/u HTN & rash & PE    HTN:  See EPIC Problem List for HPI.  He reports taking the hypertension medications as on his EPIC med list below.  He reports no difficulty with compliance or side-effects  He denies chest pain, dyspnea, headache, lower extremity edema or localized weakness or paresthesias  His last three blood pressures in clinic were:  Most Recent BP Reading(s)     Date:        BP:     08/28/2008   142/90     01/17/2008   140/78     12/20/2007   142/80  His Most Recent Weight Reading(s)     Date:        Wt:     08/28/2008   172 lb (78.019 kg)     01/17/2008   168 lb (76.204 kg)     12/06/2007   169 lb (76.658 kg)     11/15/2007   169 lb (76.658 kg)    last potassium was   POTASSIUM (mmol/L)   Date  Value    12/20/07  3.7    ----------  last creatinine was   CREATININE (mg/dl)   Date  Value    1/61/09  1.1    ----------    Rash  Has used shampoo as directed but rash has not resolved -- not worsened ~stable  No new symptoms     Review of symptoms:    No fevers or unexplained weight loss. No visual changes. No sore throat or ear ache.  No prolonged cough. No dyspnea or chest pain on exertion.  No abdominal pain or change in bowel habits.  No penile discharge, erectile dysfunction or difficulty urinating.  No new or unusual musculoskeletal symptoms. No new rashes or skin changes. No paresthesias or unusual headaches. No sadness or anxiety that interferes with day-to-day activities. No heat intolerance. No enlarged nodes.  No new itching, sneezing or wheezing.     Patient Active Problem List    Essential Hypertension, Benign [401.1]         Priority: Low [3]         Date Noted: 11/05/2004         On BP meds in [place]         He reports that he was originally on atenolol which          did a good job controlling his BP but was stopped          because he had some fatigue; HCTZ as well as          lisinopril have been tried but his opinion is that          these don't control his BP as well as the atenolol           did          10/09 restarted atenolol, added chlorthalidone     Personal History of Tobacco Use, Presenting Hazards to Health [V15.82]         Date Noted: 10/09/2008         quit 1989 years ago, smoked for 10 years, 1 pack per          day    DPH-CARE COORDINATION PROGRAM PARTICIPANT [[id number]]         Date Noted: 12/20/2007         Enrolled 12/20/07  3/10/09Referral to Risk Reduction/Health Educator         Referral to Registered Dietician                  Back to patient navigator to coordinate and make          sure patient gets to appts.         Added to pn and rre list.                      Cerebral Calcification [348.89C]         Date Noted: 12/08/2007         negative T solium antibody    Abnormal EKG [794.31S]         Date Noted: 12/08/2007         Qs in V1 and V2 - echo normal    PREMATURE EJACULATION [302.75]         Date Noted: 11/05/2004         paroxetine prn       Current outpatient prescriptions prior to encounter:  ATENOLOL 50 MG OR TABS 1 TABLET DAILY Disp: 100 Rfl: 12   PAROXETINE HCL 10 MG OR TABS 1 TABLET DAILY PRN PREMATURE EJACULATION Disp: 30 Rfl: 11   CHLORTHALIDONE 25 MG OR TABS 1/2 TABLET PO DAILY Disp: 60 Rfl: 4       Review of Patient's Allergies indicates:  No Known Allergies.    Past Medical History    PREMATURE EJACULATION 11/05/2004    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 11/05/2004    JOINT PAIN-L/LEG 11/05/2004    Comment: DJD Left knee       No past surgical history on file.  Social History    Marital Status: Married             Spouse Name:                       Years of Education:                 Number of children: 1             Occupational History  Occupation          Engineer, water                OTHER                   Social History Main Topics    Tobacco Use: Quit          Packs/Day:       Years:            Comment: quit 20 years ago (1989), smoked for 10                 years, 1 pack per  day    Alcohol Use: Yes                Comment: minimal    Drug Use: No  Sexual Activity: Yes             Partners with: Male    Social History Narrative    From [place], [place]; arrived in [place] in 2001    SH: married 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   Eyes: PERL bilaterally, anicteric conjunctiva, able to read small print  Ear exam - both sides normal, TM intact without perforation or effusion, external canal normal. No significant cerumenosis noted.   Nasal exam; septum midline, no deformities, nares patent, normal mucosa without swelling, no polyps, no bleeding.   Throat:  Oral cavity, tongue, pharynx and palate have no inflammation, exudates or ulcers.   Neck: supple and free of adenopathy, or masses.  No thyromegaly.   Chest: clear to inspection & ausculation, no crackles, rhonchi or wheezes.   Heart: sounds are normal, no murmurs, clicks, gallops or rubs. Abdomen: soft, no tenderness, masses or organomegaly.   Extremities: peripheral pulses and reflexes are normal.   GU: Testes are normal without masses, no hernias noted.  Phallus normal. Rectal: Rectum has normal tone and is without masses. Prostate normal in size; non-tender, soft, symmetric without nodules. Stool guaiac was not indicated today.   Screening neurological exam is normal without focal findings.   Skin: no suspicious lesions   Mental status exam: he is alert, orient to time, person and place. Normal thought content, speech, affect, mood and dress are noted.    ASSESSMENT &  PLAN:  V70.0 Routine General Medical Examination at a Health Care Facility  (primary encounter diagnosis)  Comment: we reviewed all health maintenance issues -- main factor is to better manage his BP    401.1 Essential Hypertension, Benign  Not well controlled  he experienced fatigue with HCTZ -- I do not think this is an allergy -- and uncertain if it was truly as side effect of the medicine -- therefore, safe to use another agent in the same class and will use  chlorthalidone   Considered increasing beta-blocker but because of low pulse favor using different agent  On reviewing comparative data of HCTZ vs chlorthalidone it appears that there are reasons to believe that chlorthalidone is more effective in preventing long-term sequelae of hypertension.  Therefore, today we discussed this change.  HCTZ has been discontinued and chlorthalidone has been started.  Since chlorthalidone doses are roughly twice as potent as HCTZ his dose has been decreased by 50%.  We reviewed that this type of medicine can cause low potassium, and we will need to repeat blood work within 2 weeks.  In addition, we reviewed that any leg cramps or other new symptoms should prompt a call to clinic.   Plan: POTASSIUM, ASSAY OF UREA NITROGEN, ASSAY OF         CREATININE, ROUTINE VENIPUNCTURE  Will have repeat BP RN check in ~3 weeks at that time will need the repeat blood  work as well       Rash -- likely tinea versicolor  Did not respond to shampoo -- but also not bothering him or worsening -- we will observe & readdress if doesn't fade by itself    V15.82 Personal History of Tobacco Use, Presenting Hazards to Health  Comment: counseled on the importance of complete abstence from tobacco because even one cigarette creates a high risk for relapse      follow-up will be scheduled for 3 wks from now  he has been advised to call or return with any worsening or new problems

## 2008-10-30 ENCOUNTER — Ambulatory Visit (HOSPITAL_BASED_OUTPATIENT_CLINIC_OR_DEPARTMENT_OTHER): Payer: PRIVATE HEALTH INSURANCE | Admitting: Internal Medicine

## 2008-10-30 VITALS — BP 120/80 | HR 56 | Temp 97.4°F | Wt 169.0 lb

## 2008-10-30 DIAGNOSIS — R21 Rash and other nonspecific skin eruption: Secondary | ICD-10-CM

## 2008-10-30 DIAGNOSIS — I10 Essential (primary) hypertension: Principal | ICD-10-CM

## 2008-10-30 LAB — CHG CREATININE BLOOD: CREATININE: 1.1 mg/dl (ref 0.7–1.2)

## 2008-10-30 LAB — POTASSIUM: POTASSIUM: 3.4 mmol/L — ABNORMAL LOW (ref 3.5–5.1)

## 2008-10-30 LAB — ASSAY OF UREA NITROGEN QUANTITATIVE: BUN (UREA NITROGEN): 19 mg/dl (ref 6–20)

## 2008-10-30 NOTE — Progress Notes (Signed)
SUBJECTIVE: Via phone intepreter    Jonathan Cole is a 47 y.o who presents to clinic for BP check  Is currently taking Chlorthalidone and Atenolol without any side effects.    Voices concerns of continuation of hypopigmented circular patches on his chest and back. Was given a RX for Ketoconazole shampoo by PCP for tinea versicolor. Denies itchy,irritation.   States the patches has improved slightly.     Denies fevers, chills, body aches, chest pain, SOB, n/v, abdominal pain, swelling of his extremities.      Patient Active Problem List:     PREMATURE EJACULATION [302.75]     Essential Hypertension, Benign [401.1]     Cerebral Calcification [348.89C]     Abnormal EKG [794.31S]     DPH-CARE COORDINATION PROGRAM PARTICIPANT [[id number]]     Personal History of Tobacco Use, Presenting Hazards to Health [V15.82]      Past Medical History    PREMATURE EJACULATION 11/05/2004    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 11/05/2004    JOINT PAIN-L/LEG 11/05/2004    Comment: DJD Left knee       No past surgical history on file.    Current outpatient prescriptions prior to encounter:  CHLORTHALIDONE 25 MG OR TABS 1/2 TABLET PO DAILY Disp: 60 Rfl: 4   ATENOLOL 50 MG OR TABS 1 TABLET DAILY Disp: 100 Rfl: 12   PAROXETINE HCL 10 MG OR TABS 1 TABLET DAILY PRN PREMATURE EJACULATION Disp: 30 Rfl: 11       OBJECTIVE:  General: A+0x3, NAD  BP 120/80   Pulse 56   Temp (Src) 97.4 F (36.3 C) (Oral)   Wt 169 lb (76.658 kg)   SpO2 100%  SKIN: Hypopigmented circular patches are noted on torso. No erythema, scaling, blisters, ulcerations noted    ASSESSMENT/PLAN:  401.1 Essential Hypertension, Benign  (primary encounter diagnosis)  Comment: BP stable  To continue with medications as ordered  Diet and exercise modifications discussed  Pt sent to lab- future labs per PCP  Plan: POTASSIUM, ASSAY OF UREA NITROGEN, ASSAY OF         CREATININE, ROUTINE VENIPUNCTURE        782.1R Rash  Comment: Findings on exam do appear to be tinea versicolor  Offered refill for  Ketoconazole and referral to Dermatology. Pt declined. Request to see if patches would fade on their own. To f/u with PCP prn  Plan:

## 2008-11-02 ENCOUNTER — Encounter (HOSPITAL_BASED_OUTPATIENT_CLINIC_OR_DEPARTMENT_OTHER): Payer: Self-pay | Admitting: Internal Medicine

## 2008-11-02 ENCOUNTER — Telehealth (HOSPITAL_BASED_OUTPATIENT_CLINIC_OR_DEPARTMENT_OTHER): Payer: Self-pay | Admitting: Internal Medicine

## 2008-11-02 DIAGNOSIS — I10 Essential (primary) hypertension: Principal | ICD-10-CM

## 2008-11-02 NOTE — Telephone Encounter (Signed)
Blood pressure control is excellent on chlorthalidone but K+ is slightly low.  Best plan is to increase dietary K+ on a daily basis  Please advise -- see which of the foods below pt could add to his usual routine --  -- then repeat blood work -- already pending in 2 weeks     Here is details to share re: K+   Potassium is an essential mineral found in many foods.  To help control blood pressure, eating 2000mg  of Potassium daily is recommended.  The following list of foods, in the portions listed, supply 500 mg of potassium per serving.    Eat four servings daily from this list to help you get what you need.            Fewer than 100 Calories:   100-200 Calories:             2 large stalks of broccoli   1 large boiled potato             1 cup spinach    1/2 cup fresh lima beans             2 medium raw carrots   6 canned apricot halves             1 large cucumber    9 dried apricot halves #              1 heaping cup raw cabbage  1 cup sliced banana (= 1 medium)             5 large mushrooms    7 dates #             1 small baked potato   4 grapefruit halves #             15 small radishes    1 1/2 grapefruit juice #             3/4 cup winter squash   3 nectarines             12 medium brussel sprouts  2 medium oranges             1 1/2 artichoke (whole)  1 cup orange juice #             2 small raw tomatoes   7 prunes #             1 cup salt-free tomato juice  1 cup prune juice #             5 medium plums    3/8 cup raisins #             3/4 cup canned pumpkin   2 1/2 cups watermelon             2 medium peaches    2  1/2 Tbsp molasses #             1/2 medium cantaloupe             1 whole grapefruit                  *  None of the foods on this list will  exceed 50 mg of sodium per serving

## 2008-11-04 NOTE — Telephone Encounter (Signed)
Call to patient, advised as below.given examples as below of potassium rich foods.will return in two weeksfor blood work.agrees with plan as below.to dr Noel Gerold as fyi.dt,rn

## 2008-11-27 ENCOUNTER — Telehealth (HOSPITAL_BASED_OUTPATIENT_CLINIC_OR_DEPARTMENT_OTHER): Payer: Self-pay | Admitting: Internal Medicine

## 2008-11-27 NOTE — Telephone Encounter (Signed)
Left message for pt to come to lab for blood tests before the end of next week. Not fasting.

## 2008-11-29 ENCOUNTER — Ambulatory Visit (HOSPITAL_BASED_OUTPATIENT_CLINIC_OR_DEPARTMENT_OTHER): Payer: PRIVATE HEALTH INSURANCE | Admitting: Lab

## 2008-11-29 ENCOUNTER — Encounter (HOSPITAL_BASED_OUTPATIENT_CLINIC_OR_DEPARTMENT_OTHER): Payer: Self-pay | Admitting: Internal Medicine

## 2008-11-29 DIAGNOSIS — I10 Essential (primary) hypertension: Principal | ICD-10-CM

## 2008-11-29 LAB — CHG CREATININE BLOOD: CREATININE: 1 mg/dl (ref 0.7–1.2)

## 2008-11-29 LAB — POTASSIUM: POTASSIUM: 3.5 mmol/L (ref 3.5–5.1)

## 2008-11-29 LAB — ASSAY OF UREA NITROGEN QUANTITATIVE: BUN (UREA NITROGEN): 19 mg/dl (ref 6–20)

## 2008-11-29 NOTE — Progress Notes (Signed)
Lab drawn by mr.

## 2009-05-20 ENCOUNTER — Other Ambulatory Visit (HOSPITAL_BASED_OUTPATIENT_CLINIC_OR_DEPARTMENT_OTHER): Payer: Self-pay

## 2009-05-20 NOTE — Telephone Encounter (Signed)
Staff Message copied by Ledell Peoples on Tue May 20, 2009 3:24 PM  ------   Message from: Silver Huguenin   Created: Tue May 20, 2009 3:21 PM   Regarding: refill   Contact: [phone]    Jonathan Cole is a 48 year old old male.    Patient's PCP: Greer Ee, MD    In case we get disconnected what is the best number to reach you at today:  Home Phone [phone] (home)    Person calling:  Patient (self)    How can I help you today:   Medication Refill(s):   [facility]  Phone: [phone] Fax: [phone]    Is this your pharmacy? Yes  What medications do you want to refill?  ATENOLOL l 50mg    CHLORTHALIDONE 25 MG OR TABS    Patient's language of care: [place]    Would you like an interpreter when the nurse calls you back?  NO

## 2009-05-20 NOTE — Telephone Encounter (Signed)
Jonathan Cole is a 48 year old male calling to request a refill of chlorthalidone and atenolol.  Per Epic patient has refills. Confirmed with [facility] pharmacy. Patient informed.  HTN Med:    Most Recent BP Reading(s)     Date:        BP:     10/30/2008   120/80     10/09/2008   146/102     08/28/2008   142/90    Documented patient preferred pharmacies:  [facility] ([facility])Phone: [phone] Fax: [phone]

## 2009-06-11 ENCOUNTER — Encounter (HOSPITAL_BASED_OUTPATIENT_CLINIC_OR_DEPARTMENT_OTHER): Payer: Self-pay | Admitting: Internal Medicine

## 2009-06-11 ENCOUNTER — Ambulatory Visit (HOSPITAL_BASED_OUTPATIENT_CLINIC_OR_DEPARTMENT_OTHER): Payer: PRIVATE HEALTH INSURANCE | Admitting: Internal Medicine

## 2009-06-11 VITALS — BP 122/84 | HR 52 | Temp 98.2°F | Ht 67.0 in | Wt 176.0 lb

## 2009-06-11 DIAGNOSIS — E663 Overweight: Secondary | ICD-10-CM

## 2009-06-11 DIAGNOSIS — E669 Obesity, unspecified: Secondary | ICD-10-CM

## 2009-06-11 DIAGNOSIS — I10 Essential (primary) hypertension: Secondary | ICD-10-CM

## 2009-06-11 DIAGNOSIS — F524 Premature ejaculation: Secondary | ICD-10-CM

## 2009-06-11 DIAGNOSIS — K219 Gastro-esophageal reflux disease without esophagitis: Principal | ICD-10-CM

## 2009-06-11 LAB — POTASSIUM: POTASSIUM: 3.5 mmol/L (ref 3.5–5.1)

## 2009-06-11 LAB — CHG CREATININE BLOOD: CREATININE: 1 mg/dl (ref 0.7–1.2)

## 2009-06-11 LAB — ASSAY OF UREA NITROGEN QUANTITATIVE: BUN (UREA NITROGEN): 14 mg/dl (ref 6–20)

## 2009-06-11 MED ORDER — ATENOLOL 50 MG PO TABS
ORAL_TABLET | ORAL | Status: DC
Start: 2009-06-11 — End: 2010-07-03

## 2009-06-11 MED ORDER — CHLORTHALIDONE 25 MG PO TABS
ORAL_TABLET | ORAL | Status: DC
Start: 2009-06-11 — End: 2010-07-03

## 2009-06-11 MED ORDER — PAROXETINE HCL 10 MG PO TABS
ORAL_TABLET | ORAL | Status: DC
Start: 2009-06-11 — End: 2010-07-03

## 2009-06-11 NOTE — Patient Instructions (Signed)
Medicines you can buy without a prescription for heartburn    Stomach coating (acid blocking) medicines     Sodium bicarbonate (baking soda). These are high in salt so avoid them if you have hypertension, congestive heart failure or other reasons to avoid salt. Using for many months can cause kidney problems. Also, avoid those combination products that contain aspirin.  Options include Alka-Selzter and Research scientist (life sciences).     Calcium carbonate.  May cause constipation.  Options include Rolaids or Tums.     Aluminum compounds Can cause constipation or lower your body's calcium if used for a long time. These are slower acting medicines.  Options include AternaGel and Amphogel.     Magnesium compounds.  May cause diarrhea.  Use of large amounts for long periods of time can cause low blood pressure and irregular heart beats especially in older people.  Options include Maalox, Mylanta, Gelusil and Rolaids.      Medicines that decrease the amount of acid your stomach makes    These pills need to be taken a half an hour in advance to prevent heartburn.  They last for about three to five hours. If heartburn has already started, then take one of the medicines described above.  Side effects include headache, nausea and constipation.  If you are taking other medications make sure that you check with your doctor before taking one of these to avoid interactions.             Medication Daily dosage Maximum daily dosage Cost (approx)   Axid/nizatidine 75 mg twice  150 mg twice  $0.32 per tablet   Pepcid AC/famotidine 10 mg twice  20 mg twice  $0.17 per tablet   Tagamet HB/cimetidine 200 mg twice  400 mg twice  $0.12 per tablet   Zantac 75/ranitidine 75 mg twice  150 mg twice  $0.50 per tablet   Use the generic name (second name listed in bold) when buying them to save money.

## 2009-06-11 NOTE — Progress Notes (Signed)
.  .  .    Reflux  He notes that in [place] he had burning in his chest -- had a EGD & was told it was normal -- records not available  He now notes that over the last few months those symptoms returned  When he eats -- sometimes -- he feels a burning in his chest that goes up to his throat & associated with a sour taste in his mouth  He denies any other symptoms such as weight loss, change in color of stool, fever, nausea or vomiting.  He has not tried anything for these symptoms     Premature ejactulation  He reports that the prn use of paroxetine is working well    Obesity  Unfortunately, he continues to gain weight since last visit  He reports that he drinks a lot of soda & his diet is not particularly healthy      HTN:  See EPIC Problem List for HPI.  He reports taking the hypertension medications as on his EPIC med list below.  He reports no difficulty with compliance or side-effects  He denies chest pain, dyspnea, headache, lower extremity edema or localized weakness or paresthesias  His last three blood pressures in clinic were:  Most Recent BP Reading(s)     Date:        BP:     06/11/2009   122/84     10/30/2008   120/80     10/09/2008   146/102  His Most Recent Weight Reading(s)     Date:        Wt:     06/11/2009   176 lb (79.833 kg)     10/30/2008   169 lb (76.658 kg)     10/09/2008   170 lb (77.111 kg)     08/28/2008   172 lb (78.019 kg)    last potassium was   POTASSIUM (mmol/L)   Date  Value    11/29/08  3.5    ----------  last creatinine was   CREATININE (mg/dl)   Date  Value    11/27/08  1.0    ----------    Social History Narrative    From [place], [place]; arrived in [place] in 2001    SH: married 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 No new headaches. No  shortness of breath or chest pain.     BP 122/84   Pulse 52   Temp (Src) 98.2 F (36.8 C) (Oral)   Ht 5\' 7"  (1.702 m)   Wt 176 lb (79.833 kg)   SpO2 98%  Heart: S1 and S2 normal, no murmurs, clicks, gallops or rubs. Regular rate and rhythm.   Lungs:  clear; no wheezes, rhonchi or rales.  Abdomen: bowel sounds are normal. obese, soft without tenderness, guarding, mass, rebound or organomegaly.     ASSESSMENT & PLAN:  401.1 Essential Hypertension, Benign  (primary encounter diagnosis)  Comment: well controlled today -- I'm concerned about his continued weight gain & we have focused a lot of attn on this today; as his K+ has been borderline -- will check  Plan: POTASSIUM, ASSAY OF UREA NITROGEN, ASSAY OF  CREATININE, ROUTINE VENIPUNCTURE            530.81K GERD (Gastroesophageal Reflux Disease)  Comment: given the syndrome that he has it's almost assuredly GERD -- he's already had an EGD in the past & has no alarm symptoms -- we discussed this dx in detail -- altho' we reviewed in detail OTC options -- see pt educ section      278.00J Obesity  Comment: we discussed in detail & gave written materials in [employer]: discussed basic principles of dieting today including:  - minimizing eating fried foods  - keeping portion sizes small, especially in respect to pasta, breads & desserts  - eating a lot of vegetables while cooking these in only a little bit of oil  - getting regular exercise, ideally 30 minutes on most days of the week  - reviewed that meds are not recommended as they often have side effects and he will gain the weight again after stopping them     Premature ejcatulation refilled 'script    follow-up will be scheduled for 6 months from now  he has been advised to call or return with any worsening or new problems

## 2009-08-25 ENCOUNTER — Encounter (HOSPITAL_BASED_OUTPATIENT_CLINIC_OR_DEPARTMENT_OTHER): Payer: Self-pay

## 2010-07-03 ENCOUNTER — Other Ambulatory Visit (HOSPITAL_BASED_OUTPATIENT_CLINIC_OR_DEPARTMENT_OTHER): Payer: Self-pay | Admitting: Internal Medicine

## 2010-07-03 MED ORDER — PAROXETINE HCL 10 MG PO TABS
10.0000 mg | ORAL_TABLET | Freq: Every day | ORAL | Status: AC | PRN
Start: 2010-07-03 — End: 2010-10-01

## 2010-07-03 MED ORDER — CHLORTHALIDONE 25 MG PO TABS
12.5000 mg | ORAL_TABLET | ORAL | Status: DC
Start: 2010-07-03 — End: 2010-11-26

## 2010-07-03 MED ORDER — ATENOLOL 50 MG PO TABS
50.0000 mg | ORAL_TABLET | Freq: Every day | ORAL | Status: AC
Start: 2010-07-03 — End: 2010-10-01

## 2010-07-03 NOTE — Telephone Encounter (Signed)
Overdue for exam, boked within 3 months so will order 3 months supply in cross cover

## 2010-07-03 NOTE — Telephone Encounter (Signed)
Jonathan Cole is a 49 year old male has requested a refill of Chlorthalidone, Atenolol and Fluoxetine. I believe patient is looking for Paxil not prozac, has not been on prozac since 2006. Please review. Thank you       Other Med Adult:  Most Recent BP Reading(s)  06/11/09 : 122/84        Cholesterol (mg/dl)   Date     Date  Value    11/15/2007  189    ----------    LDL (mg/dl)   Date     Date  Value    11/15/2007  138*   ----------    HDL (mg/dl)   Date     Date  Value    11/15/2007  47    ----------    TRIGLYCERIDES (mg/dl)   Date     Date  Value    11/15/2007  37    ----------        No results found for this basename: TSHSC:1        No results found for this basename: TSH:1      No results found for this basename: hgba1c:1        No results found for this basename: INR:3       Documented patient preferred pharmacies:  [facility] ([facility])Phone: [phone] Fax: [phone]

## 2010-10-14 ENCOUNTER — Ambulatory Visit (HOSPITAL_BASED_OUTPATIENT_CLINIC_OR_DEPARTMENT_OTHER): Payer: Medicaid Other | Admitting: Internal Medicine

## 2010-11-26 ENCOUNTER — Other Ambulatory Visit (HOSPITAL_BASED_OUTPATIENT_CLINIC_OR_DEPARTMENT_OTHER): Payer: Self-pay | Admitting: Internal Medicine

## 2010-11-26 DIAGNOSIS — I119 Hypertensive heart disease without heart failure: Principal | ICD-10-CM

## 2010-11-26 MED ORDER — CHLORTHALIDONE 25 MG PO TABS
12.5000 mg | ORAL_TABLET | ORAL | Status: DC
Start: 2010-11-26 — End: 2010-12-23

## 2010-11-26 MED ORDER — LISINOPRIL 10 MG PO TABS
10.0000 mg | ORAL_TABLET | Freq: Every day | ORAL | Status: DC
Start: 2010-11-26 — End: 2010-12-23

## 2010-11-27 ENCOUNTER — Telehealth (HOSPITAL_BASED_OUTPATIENT_CLINIC_OR_DEPARTMENT_OTHER): Payer: Self-pay | Admitting: Ambulatory Care

## 2010-12-01 ENCOUNTER — Telehealth (HOSPITAL_BASED_OUTPATIENT_CLINIC_OR_DEPARTMENT_OTHER): Payer: Self-pay | Admitting: Ambulatory Care

## 2010-12-01 NOTE — Telephone Encounter (Signed)
Message copied by Windell Moulding on Tue Dec 01, 2010  3:09 PM  ------       Message from: Riccardo Dubin       Created: Tue Dec 01, 2010  2:45 PM       Regarding: Returning nurse's call       Contact: [phone]         Jonathan Cole is a 50 year old old male.              Patient's PCP: Greer Ee, MD              In case we get disconnected what is the best number to reach you at today:       Home Phone [phone] (home)              Person calling:       Patient (self)              How can I help you today:        Patient is returning nurse's call.              Patient's language of care: [place]              Would you like an interpreter when the nurse calls you back?       YES  [place]

## 2010-12-23 ENCOUNTER — Encounter (HOSPITAL_BASED_OUTPATIENT_CLINIC_OR_DEPARTMENT_OTHER): Payer: Self-pay | Admitting: Internal Medicine

## 2010-12-23 ENCOUNTER — Ambulatory Visit (HOSPITAL_BASED_OUTPATIENT_CLINIC_OR_DEPARTMENT_OTHER): Payer: PRIVATE HEALTH INSURANCE | Admitting: Internal Medicine

## 2010-12-23 VITALS — BP 122/84 | HR 84 | Temp 98.7°F | Ht 66.93 in | Wt 167.0 lb

## 2010-12-23 DIAGNOSIS — I10 Essential (primary) hypertension: Secondary | ICD-10-CM

## 2010-12-23 DIAGNOSIS — Z0000 Encounter for general adult medical examination without abnormal findings: Principal | ICD-10-CM

## 2010-12-23 DIAGNOSIS — Z23 Encounter for immunization: Secondary | ICD-10-CM

## 2010-12-23 DIAGNOSIS — E663 Overweight: Secondary | ICD-10-CM

## 2010-12-23 DIAGNOSIS — F524 Premature ejaculation: Secondary | ICD-10-CM

## 2010-12-23 LAB — CHG CREATININE BLOOD: CREATININE: 1 mg/dl (ref 0.7–1.2)

## 2010-12-23 LAB — BUN (UREA NITROGEN): BUN (UREA NITROGEN): 16 mg/dl (ref 6–20)

## 2010-12-23 LAB — POTASSIUM: POTASSIUM: 3.9 mmol/L (ref 3.5–5.1)

## 2010-12-23 MED ORDER — LISINOPRIL 10 MG PO TABS
10.0000 mg | ORAL_TABLET | Freq: Every day | ORAL | Status: AC
Start: 2010-12-23 — End: 2011-12-24

## 2010-12-23 MED ORDER — CHLORTHALIDONE 25 MG PO TABS
12.5000 mg | ORAL_TABLET | ORAL | Status: AC
Start: 2010-12-23 — End: 2011-12-24

## 2010-12-24 ENCOUNTER — Ambulatory Visit (HOSPITAL_BASED_OUTPATIENT_CLINIC_OR_DEPARTMENT_OTHER): Payer: PRIVATE HEALTH INSURANCE

## 2011-01-06 ENCOUNTER — Ambulatory Visit (HOSPITAL_BASED_OUTPATIENT_CLINIC_OR_DEPARTMENT_OTHER): Payer: PRIVATE HEALTH INSURANCE

## 2011-01-06 DIAGNOSIS — F411 Generalized anxiety disorder: Principal | ICD-10-CM

## 2011-01-20 ENCOUNTER — Encounter (HOSPITAL_BASED_OUTPATIENT_CLINIC_OR_DEPARTMENT_OTHER): Payer: Self-pay | Admitting: Internal Medicine

## 2011-01-20 ENCOUNTER — Ambulatory Visit (HOSPITAL_BASED_OUTPATIENT_CLINIC_OR_DEPARTMENT_OTHER): Payer: PRIVATE HEALTH INSURANCE

## 2011-01-20 DIAGNOSIS — F411 Generalized anxiety disorder: Principal | ICD-10-CM

## 2011-01-27 ENCOUNTER — Ambulatory Visit (HOSPITAL_BASED_OUTPATIENT_CLINIC_OR_DEPARTMENT_OTHER): Payer: PRIVATE HEALTH INSURANCE

## 2011-01-27 DIAGNOSIS — F411 Generalized anxiety disorder: Principal | ICD-10-CM

## 2011-02-03 ENCOUNTER — Ambulatory Visit (HOSPITAL_BASED_OUTPATIENT_CLINIC_OR_DEPARTMENT_OTHER): Payer: PRIVATE HEALTH INSURANCE

## 2011-02-03 DIAGNOSIS — F411 Generalized anxiety disorder: Principal | ICD-10-CM

## 2011-02-17 ENCOUNTER — Ambulatory Visit (HOSPITAL_BASED_OUTPATIENT_CLINIC_OR_DEPARTMENT_OTHER): Payer: PRIVATE HEALTH INSURANCE

## 2011-02-17 DIAGNOSIS — F411 Generalized anxiety disorder: Principal | ICD-10-CM

## 2011-03-03 ENCOUNTER — Ambulatory Visit (HOSPITAL_BASED_OUTPATIENT_CLINIC_OR_DEPARTMENT_OTHER): Payer: PRIVATE HEALTH INSURANCE

## 2011-03-15 ENCOUNTER — Telehealth (HOSPITAL_BASED_OUTPATIENT_CLINIC_OR_DEPARTMENT_OTHER): Payer: Self-pay

## 2011-03-15 DIAGNOSIS — F524 Premature ejaculation: Principal | ICD-10-CM

## 2011-03-24 ENCOUNTER — Ambulatory Visit (HOSPITAL_BASED_OUTPATIENT_CLINIC_OR_DEPARTMENT_OTHER): Payer: PRIVATE HEALTH INSURANCE

## 2011-04-07 ENCOUNTER — Telehealth (HOSPITAL_BASED_OUTPATIENT_CLINIC_OR_DEPARTMENT_OTHER): Payer: Self-pay

## 2011-04-07 ENCOUNTER — Ambulatory Visit (HOSPITAL_BASED_OUTPATIENT_CLINIC_OR_DEPARTMENT_OTHER): Payer: PRIVATE HEALTH INSURANCE

## 2011-04-21 ENCOUNTER — Ambulatory Visit (HOSPITAL_BASED_OUTPATIENT_CLINIC_OR_DEPARTMENT_OTHER): Payer: PRIVATE HEALTH INSURANCE

## 2011-04-27 ENCOUNTER — Telehealth (HOSPITAL_BASED_OUTPATIENT_CLINIC_OR_DEPARTMENT_OTHER): Payer: Self-pay

## 2011-05-05 ENCOUNTER — Ambulatory Visit (HOSPITAL_BASED_OUTPATIENT_CLINIC_OR_DEPARTMENT_OTHER): Payer: PRIVATE HEALTH INSURANCE

## 2011-05-19 ENCOUNTER — Ambulatory Visit (HOSPITAL_BASED_OUTPATIENT_CLINIC_OR_DEPARTMENT_OTHER): Payer: PRIVATE HEALTH INSURANCE

## 2011-06-28 ENCOUNTER — Ambulatory Visit (HOSPITAL_BASED_OUTPATIENT_CLINIC_OR_DEPARTMENT_OTHER): Payer: PRIVATE HEALTH INSURANCE | Admitting: Urology

## 2011-09-01 ENCOUNTER — Ambulatory Visit (HOSPITAL_BASED_OUTPATIENT_CLINIC_OR_DEPARTMENT_OTHER): Payer: PRIVATE HEALTH INSURANCE | Admitting: Internal Medicine

## 2011-09-01 VITALS — BP 124/62 | HR 64 | Temp 98.7°F | Ht 66.93 in | Wt 162.0 lb

## 2011-09-01 DIAGNOSIS — Z23 Encounter for immunization: Secondary | ICD-10-CM

## 2011-09-01 DIAGNOSIS — L300 Nummular dermatitis: Secondary | ICD-10-CM

## 2011-09-01 DIAGNOSIS — E663 Overweight: Secondary | ICD-10-CM

## 2011-09-01 DIAGNOSIS — F524 Premature ejaculation: Principal | ICD-10-CM

## 2011-09-01 DIAGNOSIS — I10 Essential (primary) hypertension: Secondary | ICD-10-CM

## 2011-09-01 MED ORDER — FLUOXETINE HCL 20 MG PO CAPS
20.0000 mg | ORAL_CAPSULE | Freq: Every morning | ORAL | Status: DC
Start: 2011-09-01 — End: 2011-09-01

## 2011-09-01 MED ORDER — TRIAMCINOLONE ACETONIDE 0.1 % EX OINT
TOPICAL_OINTMENT | Freq: Two times a day (BID) | CUTANEOUS | Status: AC
Start: 2011-09-01 — End: 2011-11-30

## 2011-09-01 MED ORDER — FLUOXETINE HCL 20 MG PO CAPS
20.0000 mg | ORAL_CAPSULE | Freq: Every morning | ORAL | Status: DC
Start: 2011-09-01 — End: 2012-02-23

## 2011-09-01 NOTE — Progress Notes (Signed)
Pt requesting Flu Vaccine . Confirmed patient's name and date of birth. Pt denies allergies to this vaccine. Pt denies allergies to egg or egg products. Pt denies allergy to contact lens solution. Pt denies pregnancy.     Pt denies adverse effects from previous administration of this medication. Pt denies history of Guillain-Barre' syndrome. Pt denies moderate/severe illness at this time. Risks and benefits of Flu Vaccine reviewed with pt. VIS for Flu Vaccine offered and reviewed with pt.   Flu Vaccine 0.5.ml IM administered. Tolerated well by patient. Patient denies adverse effects from injection at this time. Patient encouraged to utilize arm and not favor it. Patient informed may take pain reliever of choice and to apply ice for discomfort if necessary. Patient will call with any questions or concerns. Please refer to Imm./Inj. section for administration site, lot # and exp. date.  Patient was encouraged to wait for twenty minutes in the lobby.

## 2011-09-01 NOTE — Progress Notes (Signed)
Cc: rash, HTN & premature ejaculation     premature ejaculation   He notes that this is causing him great stress  He cannot satisfy his wife because of how quickly he ejaculates & is very stressed about this & can't stop thinking about it  In the past he tried paroxetine which he found just made him tired during the day & didn't help with delaying ejaculations  He denies penile discharge or any other symptoms   He has previously seen therapist for this & that didn't help either    Rash  He notes that he has a rash on his upper shoulders & upper back that tends to come & go with the seasons    HTN:  See EPIC Problem List for HPI.  He reports taking the hypertension medications as on his EPIC med list below.  He reports no difficulty with compliance or side-effects  He denies chest pain, dyspnea, headache, lower extremity edema or localized weakness or paresthesias  His last three blood pressures in clinic were:  Most Recent BP Reading(s)  09/01/11 : 124/62  12/23/10 : 122/84  06/11/09 : 122/84  His Most Recent Weight Reading(s)  09/01/11 : 162 lb (73.483 kg)  12/23/10 : 167 lb (75.751 kg)  06/11/09 : 176 lb (79.833 kg)  10/30/08 : 169 lb (76.658 kg)    last potassium was   POTASSIUM (mmol/L)   Date     Date  Value    12/23/2010  3.9    ----------  last creatinine was   CREATININE (mg/dl)   Date     Date  Value    12/23/2010  1.0    ----------    Social History Narrative    From [place], [place]; arrived in [place] in 2001    SH: married 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 No new headaches. No shortness of breath or chest pain.     BP 124/62  Pulse 64  Temp(Src) 98.7 F (37.1 C) (Oral)  Ht 5' 6.93" (1.7 m)  Wt 162 lb (73.483 kg)  BMI 25.43 kg/m2  SpO2  99%  Heart: S1 and S2 normal, no murmurs, clicks, gallops or rubs. Regular rate and rhythm.   Lungs:  clear; no wheezes, rhonchi or rales.  MMSE: well groomed, affect anxious, no tangential thoughts, no pressured speech, no agitation or psychomotor slowing, normal response-time to questions  Skin: circular/oval lesions ~2-3 cm in diameter over his upper shoulders & back    ASSESSMENT & PLAN:  302.75 Premature ejaculation  (primary encounter diagnosis)  Comment: this is the problem that is bothering him the most -- extensive counseling today -- initially he wanted to see a urologist but after we discussed in depth, he agrees to give fluoxetine & exercise a trial over the next several months & then to loop back to me if this is not helping  We also discussed the importance of avoiding sexual enhancement supplements & he hasn't tried them & agrees to avoid    V04.81V Influenza vaccine needed  Plan: IMMUNIZATION ADMIN SINGLE, RN, INFLUENZA VIRUS         VACCINE SPLIT VIRUS 3/> YRS IM  401.1 Essential hypertension, benign  Comment: well controlled continue current regimen     278.02 Overweight  Comment: he has done great work on modest weight loss       Nummular eczema   Comment: I have considered other dx but I think that the lesions appear to be most consistent with   Plan: will start with topical corticosteroids       follow-up will be scheduled for 3 months from now  he has been advised to call or return with any worsening or new problems

## 2011-09-02 ENCOUNTER — Telehealth (HOSPITAL_BASED_OUTPATIENT_CLINIC_OR_DEPARTMENT_OTHER): Payer: Self-pay

## 2011-09-02 ENCOUNTER — Emergency Department (HOSPITAL_BASED_OUTPATIENT_CLINIC_OR_DEPARTMENT_OTHER)
Admission: RE | Admit: 2011-09-02 | Disposition: A | Discharge status: Home / Self Care | Payer: Self-pay | Source: Emergency Department | Attending: Emergency Medicine | Admitting: Emergency Medicine

## 2011-09-02 ENCOUNTER — Encounter (HOSPITAL_BASED_OUTPATIENT_CLINIC_OR_DEPARTMENT_OTHER): Payer: Self-pay | Admitting: Emergency Medicine

## 2011-09-02 LAB — COMPREHENSIVE METABOLIC PANEL
ALANINE AMINOTRANSFERASE: 37 IU/L (ref 10–40)
ALBUMIN: 4.3 g/dl (ref 3.4–4.8)
ALKALINE PHOSPHATASE: 60 IU/L (ref 25–106)
ANION GAP: 10 mmol/L (ref 3–11)
ASPARTATE AMINOTRANSFERASE: 38 IU/L — ABNORMAL HIGH (ref 8–34)
BILIRUBIN TOTAL: 0.6 mg/dl (ref 0.2–1.1)
BUN (UREA NITROGEN): 21 mg/dl — ABNORMAL HIGH (ref 6–20)
CALCIUM: 10.3 mg/dl (ref 8.6–10.3)
CARBON DIOXIDE: 27 mmol/L (ref 22–32)
CHLORIDE: 99 mmol/L — ABNORMAL LOW (ref 101–111)
CREATININE: 1 mg/dl (ref 0.7–1.2)
ESTIMATED GLOMERULAR FILT RATE: >60 mL/min (ref >60)
Glucose Random: 79 mg/dl (ref 74–160)
POTASSIUM: 3.8 mmol/L (ref 3.5–5.1)
SODIUM: 136 mmol/L (ref 135–144)
TOTAL PROTEIN: 7.6 g/dl — ABNORMAL HIGH (ref 5.9–7.5)

## 2011-09-02 LAB — CBC, PLATELET & DIFFERENTIAL
ABSOLUTE BASO COUNT: 0 10*3/uL (ref 0.0–0.1)
ABSOLUTE EOSINOPHIL COUNT: 0.1 10*3/uL (ref 0.0–0.8)
ABSOLUTE IMM GRAN COUNT: 0.02 10*3/uL (ref 0.00–0.03)
ABSOLUTE LYMPH COUNT: 1.9 10*3/uL (ref 0.6–5.9)
ABSOLUTE MONO COUNT: 1.4 10*3/uL (ref 0.2–1.4)
ABSOLUTE NEUTROPHIL COUNT: 5.8 10*3/uL (ref 1.6–8.3)
BASOPHIL %: 0.3 % (ref 0.0–1.2)
EOSINOPHIL %: 1.3 % (ref 0.0–7.0)
HEMATOCRIT: 46.1 % (ref 40.1–51.0)
HEMOGLOBIN: 16.1 g/dL (ref 13.7–17.5)
IMMATURE GRANULOCYTE %: 0.2 % (ref 0.0–0.4)
LYMPHOCYTE %: 20.3 % (ref 15.0–54.0)
MEAN CORP HGB CONC: 34.9 g/dL (ref 31.0–37.0)
MEAN CORPUSCULAR HGB: 29.7 pg (ref 26.0–34.0)
MEAN CORPUSCULAR VOL: 85.1 fL (ref 80.0–100.0)
MEAN PLATELET VOLUME: 10.2 fL (ref 8.7–12.5)
MONOCYTE %: 15.2 % — ABNORMAL HIGH (ref 4.0–13.0)
NEUTROPHIL %: 62.7 % (ref 40.0–75.0)
PLATELET COUNT: 269 10*3/uL (ref 150–400)
RBC DISTRIBUTION WIDTH STD DEV: 37.1 fL (ref 35.1–46.3)
RBC DISTRIBUTION WIDTH: 12 % (ref 11.5–14.3)
RED BLOOD CELL COUNT: 5.42 M/uL (ref 4.60–6.10)
WHITE BLOOD CELL COUNT: 9.2 10*3/uL (ref 4.0–11.0)

## 2011-09-02 LAB — XR CHEST PORTABLE

## 2011-09-02 LAB — TROPONIN I: TROPONIN I: 0.01 ng/mL (ref 0.00–0.04)

## 2011-09-02 LAB — HOLD BLUE TOP TUBE

## 2011-09-02 LAB — LIPASE: LIPASE: 29 U/L (ref 10–50)

## 2011-09-02 LAB — D-DIMER PE/DVT, QUANTITATIVE: D-DIMER PE/DVT, QUANTITATIVE: 0.53 mg/L FEU (ref 0.00–0.49)

## 2011-09-02 MED ORDER — SODIUM CHLORIDE 0.9 % IV SOLN
INTRAVENOUS | Status: DC
Start: 2011-09-02 — End: 2011-09-02
  Administered 2011-09-02: 14:00:00 via INTRAVENOUS

## 2011-09-02 MED ORDER — ALUMINUM & MAGNESIUM HYDROXIDE 200-200 MG/5ML PO SUSP
30.00 mL | Freq: Once | ORAL | Status: AC
Start: 2011-09-02 — End: 2011-09-02
  Administered 2011-09-02: 30 mL via ORAL
  Filled 2011-09-02: qty 30

## 2011-09-02 MED ORDER — ASPIRIN 81 MG PO CHEW
324.00 mg | CHEWABLE_TABLET | Freq: Once | ORAL | Status: AC
Start: 2011-09-02 — End: 2011-09-02
  Administered 2011-09-02: 324 mg via ORAL
  Filled 2011-09-02: qty 4

## 2011-09-02 MED ORDER — FAMOTIDINE 10 MG/ML IV SOLN
20.00 mg | Freq: Once | INTRAVENOUS | Status: AC
Start: 2011-09-02 — End: 2011-09-02
  Administered 2011-09-02: 20 mg via INTRAVENOUS
  Filled 2011-09-02: qty 2

## 2011-09-02 MED ORDER — RANITIDINE HCL 150 MG PO TABS
150.00 mg | ORAL_TABLET | Freq: Two times a day (BID) | ORAL | Status: AC
Start: 2011-09-02 — End: 2011-09-17

## 2011-09-02 MED ORDER — LIDOCAINE VISCOUS 2 % MT SOLN
10.00 mL | Freq: Once | OROMUCOSAL | Status: AC
Start: 2011-09-02 — End: 2011-09-02
  Administered 2011-09-02: 10 mL via OROMUCOSAL
  Filled 2011-09-02: qty 15

## 2011-09-02 NOTE — ED Provider Notes (Signed)
CC:  This 50 year old male presents with chest pain since last night    HPI: Patient seen with [place] interpreter.  He was well yesterday, had an office visit and got a flu shot, felt okay until going to bed at 8 or 9 PM, at which time he felt discomfort in the anterior chest.  He slept, and when he woke up the pain was still there about 6:30 AM.  Pain has been off-and-on for 6 or 7 hours now, though never completely gone.  It feels like an air bubble in his chest.  Nonradiating.  It is worse with movement or deep breath.  He feels he has to take shallow inspirations.  No nausea.  He has not eaten anything today. He was at work and his boss sent him home.  Patient denies prior history of chest pain, no history of heart or lung disease, though he does have history of hypertension.    Past Medical History/Problem List:    Past Medical History    PREMATURE EJACULATION 11/05/2004    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 11/05/2004    JOINT PAIN-L/LEG 11/05/2004    Comment: DJD Left knee       Patient Active Problem List:     PREMATURE EJACULATION [302.75]     Essential Hypertension, Benign [401.1]     Cerebral Calcification [348.89C]     Abnormal EKG [794.31S]     Personal History of Tobacco Use, Presenting Hazards to Health [V15.82]     Overweight [278.02]     GERD (Gastroesophageal Reflux Disease) [530.81K]      Past Surgical History:  History reviewed. No pertinent past surgical history.    Medications:     No current facility-administered medications on file prior to encounter.  Current outpatient prescriptions ordered prior to encounter:  triamcinolone (KENALOG) 0.1 % ointment Apply  topically 2 (two) times daily. Disp: 45 g Rfl: 3   fluoxetine (PROZAC) 20 MG capsule Take 1 capsule by mouth every morning. Disp: 30 capsule Rfl: 6   lisinopril (PRINIVIL,ZESTRIL) 10 MG tablet Take 1 tablet by mouth daily. Disp: 30 tablet Rfl: 11   chlorthalidone (HYGROTEN) 25 MG tablet Take 0.5 tablets by mouth. 1/2 TABLET PO DAILY  Disp: 15 tablet Rfl: 11         Allergies:  Review of Patient's Allergies indicates:  No Known Allergies    ROS:  No nausea vomiting or diarrhea.  No history of bleeding.  No urinary symptoms.  All other systems were reviewed and are negative.    Social History:  Nonsmoker (quit 15 years ago), infrequent alcohol.    FH: Mother and grandmother had heart disease, mother is still alive.      Physical Exam:  BP 141/91  Pulse 92  Resp 16  Wt 73.4 kg  SpO2 97%  GENERAL:  Well-appearing, somewhat anxious.  SKIN:  Warm & dry, no rashes, no bruising.  HEAD:  Atraumatic.   EENT:  PERRL. ENT: Oropharynx clear.  NECK:  Supple, no LAD.  LUNGS:  Clear to auscultation bilaterally without rales, rhonchi or wheezing.   HEART:  RRR.  No murmurs, rubs, or gallops.   ABDOMEN:  Soft, not distended.  Nontender to palpation.  No guarding or rebound tenderness.  MUSCULOSKELETAL:  No deformities.  No edema, no calf tenderness.  GENITOURINARY:  No CVA tenderness.   NEUROLOGIC:  Normal speech. Alert & oriented x 3.  PSYCHIATRIC:  Normal affect    ED Course and  Medical Decision-making:  ED nursing notes and prior records reviewed.  Initial electrocardiogram showed sinus rhythm with small nonspecific changes in lead 3 and old Q waves in V1 and V2, compared with electrocardiogram from 2008.  EKG repeated an hour later, unchanged. Workup was unremarkable, including negative chest x-ray, negative troponin.  D-dimer was added and was slightly above the upper limit of normal, felt to be nonspecific.  Patient was given Pepcid, Maalox, and viscous Xylocaine, and after 20 minutes felt almost complete relief of pain.  He said there was no pain at rest, but if he took a deep breath he could feel the discomfort.  Since the pain was clearly related to position and respirations, and the patient had a negative electrocardiogram and troponin after 7 hours of constant chest discomfort, it was felt he could be discharged and followup with PCP tomorrow.   Discussed with Dr. Arlice Colt, covering for patient's PCP.    Diagnosis/Diagnoses: Noncardiac chest pain.    Plan:  General instructions for above, Zantac twice a day, Maalox if needed, dietary advice, followup with PCP tomorrow.  Return to ED if worse or problems before then.    Willeen Cass, MD

## 2011-09-02 NOTE — ED Triage Note (Signed)
PT AMBULATED TO ED #1A BENT OVER, COUGHING, STATES CP, 'CAN'T BREATHE' REPORTS MID STERNAL 7/10 CP, LEANING FORWARD, [place] INTERPRETER - PT STATES CP, SOB SINCE LAST NIGHT, REPORTS RECEIVING FLU VACCINE YESTERDAY, PT WARM PINK, DRY, DENIES N/V/D - DR. SCHORIN BEDSIDE

## 2011-09-02 NOTE — ED Notes (Signed)
REVIEWED/DISCUSSED DX, NEW MED, SE, S/S TO REPORT BY DR. Randolm Idol VIA PORTUGUESE

## 2011-09-02 NOTE — Telephone Encounter (Signed)
Due to the language barrier, the phone call was conducted in [place] with an interpreter. The interpreter's name is christina. The interpreter was on the phone during the entire phone call.  Life called from the front desk along with the interpreter on the line.   Pt reported that he took a flu shot yesterday.  Today he develops chest pain with difficulty breathing.   Pt currently on [facility]  Advise pt to ED for evaluation. Pt declined ambulance advise states he will get a cab because he is at the cab station.   Try to convince pt to call ambulance for him. He hanged up.   Call him back with the interpreter. Reached his voice mail. Left message on his vm to go to ED by ambulance.

## 2011-09-02 NOTE — Discharge Instructions (Signed)
Evitar----   Avoid aspirin, Advil, ibuprofen, Alleve, greasy/fatty/fried foods, cigarettes, alcohol, and coffee.    Maalox or Mylanta 1-2 TBS hourly if needed for pain.  (1-2 culheres de sopa cada hora se precisar)    Marcar consulta -- com Dr Chilton Si (trabalha com Dr Noel Gerold) Jonathan Cole na tarde 2 PM    Voltar aqui se ha problemas          Dores no peito (de origem no determinada)  (Chest Pain, Nonspecific)     Geralmente,  difcil fazer um diagnstico especfico para a causa de uma dor no peito. H sempre a chance de que sua dor possa estar ligada a algo grave, como um ataque cardaco ou cogulo sanguneo no pulmo. Voc deve fazer o acompanhamento com seu mdico em busca de uma avaliao mais profunda.   CAUSES  As possveis causas para dores no peito incluem:   Azia.    Pneumonia ou bronquite.    Ansiedade e stress.    Inflamao em volta do corao (pericardite) ou dos pulmes (pleurite, ou pleurisia).    Um cogulo no pulmo.    Pulmo colapsado (pneumotrax). Isso pode surgir subitamente, por conta prpria (um pneumotrax espontneo) ou por algum trauma no peito.   A parede do peito  composta de ossos, msculos e cartilagem. Qualquer uma das situaes abaixo pode ser a origem da dor:    Os ossos podem ser lesionados.    Os msculos ou as cartilagens podem ser tensionados por tosse ou trabalho em excesso.    A cartilagem tambm pode ver-se afetada por inflamao e comear a doer (costocondrite).   DIAGNSTICO  Exames de laboratrio ou outros estudos, como raios-X, Designer, television/film set, testes de stress ou imagens cardacas podem ser necessrios para encontrar a Engineering geologist.   TRATAMENTO   O tratamento depender da causa da dor no peito e pode incluir:    Bloqueadores contra a Systems developer.    Medicamentos antiinflamatrios.    Medicamentos contra a dor para condies inflamatrias.    Antibiticos em caso de infeco.    Voc pode ser aconselhado a alterar seus hbitos de estilo de vida. Isso inclui parar com  cigarros, cafena e chocolate.    Voc pode ser aconselhado a manter sua cabea elevada quando dormir. Isso reduz as chances de ter cido voltando do Regulatory affairs officer.    Na [employer], a dor no peito no especfica melhora em 2 ou 3 dias apenas com repouso e remdios leves para dor.   INSTRUES PARA TRATAMENTO DOMICILIAR   Tome a quantidade de antibiticos prescritos mesmo que j se sinta melhor.    Durante alguns dias, evite atividades fsicas que provoquem a dor. Continue as atividades fsicas, como orientado.    No fume ou beba lcool at que os sintomas tenham desaparecido.    S tome medicamentos comuns ou prescritos pelo mdico para dor, desconforto ou febre, conforme orientado.    Siga as orientaes de seu mdico para a realizao de outros exames se o Solicitor.    Se seu mdico tiver prescrito uma consulta de acompanhamento,  importante que voc comparea. No comparecer  consulta poderia causar leses, dores e incapacidade crnicas ou permanentes. Caso no possa comparecer  consulta, remarque-a.   PROCURE UM MDICO SE:   Tiver problemas relacionados ao medicamento sendo tomado. Leia a bula com ateno.    A dor no peito persistir mesmo depois de seguir os tratamentos recomendados.    Aparecer erupes com bolhas no peito.  PROCURE UM MDICO IMEDIATAMENTE SE:   A dor no peito aumentar ou se a dor se espalhar para os braos, o pescoo, a mandbula, as costas ou o abdome.    Sentir falta de ar, a tosse aumentar ou se tossir sangue.    Sentir dor nas costas ou abdominal intensa, nusea ou vmitos.    Voc sentir uma fraqueza extrema, desmaios ou calafrios.    Voc desenvolver uma temperatura oral superior a 102 F (38,9 C), no controlada por remdios.   ISSO  UMA EMERGNCIA. No espere para ver se a dor vai passar. Procure ajuda mdica imediatamente. Ligue para o servio de Technical brewer (911 nos EUA). No dirija at o hospital.  ASSEGURE-SE DE QUE:    Entendeu  essas instrues.    Controlar sua condio.    Buscar ajuda imediata caso no se sinta bem ou seu estado piore.   Document Released: 10/21/2008 Document Re-Released: 04/28/2010  [facility] Patient Information 2012 [place], [place].

## 2011-09-02 NOTE — Telephone Encounter (Signed)
Spoke with ED provider.  Will see patient in clinic follow up tomorrow

## 2011-09-03 ENCOUNTER — Ambulatory Visit (HOSPITAL_BASED_OUTPATIENT_CLINIC_OR_DEPARTMENT_OTHER): Payer: PRIVATE HEALTH INSURANCE | Admitting: Internal Medicine

## 2011-09-03 LAB — EKG

## 2011-09-04 LAB — EKG

## 2011-09-13 ENCOUNTER — Telehealth (HOSPITAL_BASED_OUTPATIENT_CLINIC_OR_DEPARTMENT_OTHER): Payer: Self-pay | Admitting: Ambulatory Care

## 2011-09-13 NOTE — Telephone Encounter (Signed)
Per pcp request. VAERS form completed and faxed, will have scanned to record. Incident report completed per Raul Del  Pt seen 09/02/11 and given flu vaccine. Later same day went to ER with c/o chest pain. Diagnosis was non-cardiac chest pain.

## 2012-01-24 ENCOUNTER — Other Ambulatory Visit (HOSPITAL_BASED_OUTPATIENT_CLINIC_OR_DEPARTMENT_OTHER): Payer: Self-pay | Admitting: Internal Medicine

## 2012-01-24 NOTE — Telephone Encounter (Signed)
Person calling on behalf of patient: Pharmacy.    Jonathan Cole is a 51 year old male requesting a refill of Chlorthalidone and Lisinopril.    Last Office Visit: 09/01/2011  Last Physical Exam: 12/23/2010      Other Med Adult:  Most Recent BP Reading(s)  09/02/11 : 129/71        Cholesterol (mg/dl)   Date     Date  Value    11/15/2007  189    ----------    LDL (mg/dl)   Date     Date  Value    11/15/2007  138*   ----------    HDL (mg/dl)   Date     Date  Value    11/15/2007  47    ----------    TRIGLYCERIDE (mg/dl)   Date     Date  Value    11/15/2007  37    ----------        No results found for this basename: TSHSC        No results found for this basename: TSH      No results found for this basename: hgba1c        No results found for this basename: INR       Documented patient preferred pharmacies:  [facility] [place] [phone] Fax: [phone]  [facility] ([place])Phone: [phone] Fax: [phone]

## 2012-02-23 ENCOUNTER — Encounter (HOSPITAL_BASED_OUTPATIENT_CLINIC_OR_DEPARTMENT_OTHER): Payer: Self-pay | Admitting: Internal Medicine

## 2012-02-23 ENCOUNTER — Ambulatory Visit (HOSPITAL_BASED_OUTPATIENT_CLINIC_OR_DEPARTMENT_OTHER): Payer: PRIVATE HEALTH INSURANCE | Admitting: Internal Medicine

## 2012-02-23 VITALS — BP 142/74 | HR 53 | Temp 97.8°F | Ht 66.5 in | Wt 162.0 lb

## 2012-02-23 DIAGNOSIS — Z0000 Encounter for general adult medical examination without abnormal findings: Principal | ICD-10-CM

## 2012-02-23 DIAGNOSIS — I10 Essential (primary) hypertension: Secondary | ICD-10-CM

## 2012-02-23 DIAGNOSIS — K602 Anal fissure, unspecified: Secondary | ICD-10-CM

## 2012-02-23 DIAGNOSIS — M7582 Other shoulder lesions, left shoulder: Secondary | ICD-10-CM

## 2012-02-23 MED ORDER — NAPROXEN 500 MG PO TABS
500.00 mg | ORAL_TABLET | Freq: Two times a day (BID) | ORAL | Status: AC
Start: 2012-02-23 — End: 2012-05-23

## 2012-02-23 NOTE — Progress Notes (Signed)
Cc: PE, HTN follow up & left shoulder pain    left shoulder pain  Several weeks ago noted pain with movement of his left shoulder  Worse with work & heavier lifting  He denies any trauma to the area  No paresthesias or weakness in his left hand  He has tried icing it but has not taken any medications    HTN:  See EPIC Problem List for HPI.  He reports taking the hypertension medications as on his EPIC med list below.  He reports no difficulty with compliance or side-effects  He denies chest pain, dyspnea, headache, lower extremity edema or localized weakness or paresthesias  His last three blood pressures in clinic were:  Most Recent BP Reading(s)  02/23/12 : 142/74  09/02/11 : 129/71  09/01/11 : 124/62  His Most Recent Weight Reading(s)  02/23/12 : 162 lb (73.483 kg)  09/02/11 : 161 lb 13.1 oz (73.4 kg)  09/01/11 : 162 lb (73.483 kg)  12/23/10 : 167 lb (75.751 kg)    last potassium was   POTASSIUM (mmol/L)   Date     Date  Value    09/02/2011  3.8    ----------  last creatinine was   CREATININE (mg/dl)   Date     Date  Value    09/02/2011  1.0    ----------    Review of symptoms:    No fevers or unexplained weight loss. No visual changes. No sore throat or ear ache.  No prolonged cough. No dyspnea or chest pain on exertion.  No abdominal pain or change in bowel habits.  No penile discharge, erectile dysfunction or difficulty urinating.   No new rashes or skin changes. No paresthesias or unusual headaches. No sadness or anxiety that interferes with day-to-day activities. No heat intolerance. No enlarged nodes.  No new itching, sneezing or wheezing. + has intermittent rectal fissure x years, now it's particularly painful    Patient Active Problem List    Overweight [278.02]         Priority: Medium [2]         Date Noted: 06/11/2009            BMI 27.5            Started discussion of weight loss 7/10            Nice job of weight loss 10/12      PREMATURE EJACULATION [302.75]         Priority: Medium  [2]         Date Noted: 11/05/2004            paroxetine prn -- just made him feel tired            2/12 saw Sherlene Shams, not helpful            10/12 trial of fluoxetine & exercise       Essential Hypertension, Benign [401.1]         Priority: Medium [2]         Date Noted: 11/05/2004            On BP meds in [place]            He reports that he was originally on atenolol            which did a good job controlling his BP but was            stopped because he had some  fatigue; HCTZ as well            as lisinopril have been tried but his opinion is            that these don't             control his BP as well as the atenolol did             10/09 restarted atenolol, added chlorthalidone      GERD (Gastroesophageal Reflux Disease) [530.81K]         Priority: Low [3]         Date Noted: 06/11/2009            Had symptoms in [place] & reports a normal EGD            2010: symptoms returned as has gained 10 lbs            recently; discussed lifestyle             changes as well as OTC options      Personal History of Tobacco Use, Presenting Hazards to Health [V15.82]         Priority: Low [3]         Date Noted: 10/09/2008            quit 1989 years ago, smoked for 10 years, 1 pack            per day      Cerebral Calcification [348.89C]         Priority: Low [3]         Date Noted: 12/08/2007            negative T solium antibody      Abnormal EKG [794.31S]         Priority: Low [3]         Date Noted: 12/08/2007            Qs in V1 and V2 - echo normal        Current outpatient prescriptions ordered prior to encounter:  naproxen (NAPROSYN) 500 MG tablet Take 1 tablet by mouth 2 (two) times daily with meals. Disp: 60 tablet Rfl: 1   chlorthalidone (HYGROTEN) 25 MG tablet TAKE ONE-HALF TABLET BY MOUTH DAILY Disp: 15 tablet Rfl: 11   lisinopril (PRINIVIL,ZESTRIL) 10 MG tablet TAKE 1 TABLET BY MOUTH DAILY Disp: 30 tablet Rfl: 11   DISCONTD: fluoxetine (PROZAC) 20 MG capsule Take 1 capsule by mouth every morning.  Disp: 30 capsule Rfl: 6       Review of Patient's Allergies indicates:  No Known Allergies    Past Medical History    PREMATURE EJACULATION 11/05/2004    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 11/05/2004    JOINT PAIN-L/LEG 11/05/2004    Comment: DJD Left knee       No past surgical history on file.  Social History    Marital Status: Married             Spouse Name:                       Years of Education:                 Number of children: 1             Occupational History  Occupation          Associate Professor  Comment               busboy                                    cook                OTHER                   Social History Main Topics    Smoking Status: Former Smoker                   Packs/Day:       Years:         Comment: quit 20 years ago (1989), smoked for 10              years, 1 pack per day    Alcohol Use: Yes                Comment: minimal    Drug Use: No              Sexual Activity: Yes               Partners with: Male    Social History Narrative    From [place], [place]; arrived in [place] in 2001    SH: married 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   Eyes: PERL bilaterally, anicteric conjunctiva,   Ear exam - both sides normal, TM intact without perforation or effusion, external canal normal. No significant cerumenosis noted.   Throat:  Oral cavity, tongue, pharynx and palate have no inflammation, exudates or ulcers.   Neck: supple and free of adenopathy, or masses.  No  thyromegaly.   Chest: clear to inspection & ausculation, no crackles, rhonchi or wheezes.   Heart: sounds are normal, no murmurs, clicks, gallops or rubs.   Abdomen:soft, no tenderness, masses or organomegaly.   Extremities: peripheral pulses and reflexes are normal.   GU: Testes are normal without masses, no hernias noted.  Phallus normal. Rectal: declined.   Screening neurological exam is normal without focal findings.   Skin: no suspicious lesions  Mental status exam: he is alert, orient to time, person and place. Normal thought content, speech, affect, mood and dress are noted.  Shoulder exam - left: limited range of motion, +pain on abduction beyond 30 degrees;  no tenderness or deformity noted.   Grip strength & sensitivity to light touch equal bilaterally in upper extremities    ASSESSMENT & PLAN:  V70.0 Routine general medical examination at a health care facility  (primary encounter diagnosis)  Comment: we reviewed all RHM issues      726.10BL Tendinitis of  left rotator cuff  Comment: given the symptoms and exam findings -- altho' I considered various etiologies of shoulder pain from OA to cervical radiculopathy -- I think that rotator cuff tendinitis is most likely & will treat accordingly; will start NSAID's & refer for PT  We discussed the use of the NSAID prescribed in detail.  I explained that it should be taken on a full stomach and that it is likely to cause some stomach upset.  I reviewed the importance of stopping the NSAID if he were to notice any new rashes or any change in the color of his stool (either black or red) -- he should call the clinic immediately. I gave him instructions with all these details as well as details on how to take the medicine in my hand-out entitled Seu Remedio.   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

## 2012-03-08 ENCOUNTER — Ambulatory Visit (HOSPITAL_BASED_OUTPATIENT_CLINIC_OR_DEPARTMENT_OTHER): Payer: PRIVATE HEALTH INSURANCE | Admitting: Rehabilitative and Restorative Service Providers"

## 2012-03-08 DIAGNOSIS — M7582 Other shoulder lesions, left shoulder: Principal | ICD-10-CM

## 2012-03-08 NOTE — Patient Instructions (Signed)
Rehabilitation Treatment Flowsheet    Precautions:  51 yo male with left shoulder pain, ?rtc tear or tendonopathy. Need scap stab, ROM and pain control  Date: 03/08/12         Initials: ltm         Visit #: 1         POC Due Date:          Time: 45         HEP          Treatment(s) 3-8/10                IE                Pt ed                HEP including aa shoulder flexion, IR and ER, red TB to shoulder deloaded flexion, abd and rows                Instruction in posture and pain control

## 2012-03-08 NOTE — Progress Notes (Signed)
Marland Kitchen  OUTPATIENT EVALUATION    REFERRING PROVIDER: Greer Ee, MD  [facility]  [address]  [place], [place] [address]   Hx OF PRESENT ILLNESS: 4 year history of left shoulder pain. Started after work when he was [employer].  Pain has not resolved, worse with movement, better with rest   PRECAUTIONS:       LEARNS BEST: Demo   MENTAL STATUS/COMMUNICATION: WNL     PRIMARY LANGUAGE: [place]     REQUIRES INTERPRETER: No   INTERPRETER PRESENT DURING EVAL: No                              ASSESSMENT:     Please Note: Only populated fields were assessed by provider, fields left blank were not assessed.    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 IMAGE AVAILABLE: No    ROM/STRENGTH:     LEFT RIGHT  LEFT RIGHT    A/PROM MMT A/PROM MMT  A/PROM MMT A/PROM MMT        SHOULD       FLEX.     FLEX. 0-160 * 4     EXT.     EXT. n 4+       IR     IR 60  3+     ER     ER 80 * 4     ABD     ABD 170 * 4     H. ABD     ADD n n     H. ADD     ElBOW wnl wnl     ELBOW     FLEX       FLEX.     EXT       EXT.     ANKLE       WRIST     DF       FLEX.     PF       EXT.     IV       SUP/  PRON     EV           FF  ext Lateral flex R/L Rotation R Rotation L      LUMBAR           Cervical wnl                     SPECIAL TEST LEFT RIGHT SPECIAL TEST LEFT RIGHT SPECIAL TEST LEFT RIGHT     Neer's pos neg         Hawkin's pos                                          Physical Therapy Plan of Care  [id number] Jonathan Gerold, MD  Referring Provider:  Verlin Dike  Diagnosis: left shoulder tendonitis, ? RTC tear    Assessment/Objective Findings: Patient is a 51 year old male who reports to PT with a 4 week history of left shoulder pain.  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  Continues to work, pain at end of day and with lifting.  On exam, patient presents painful left shoulder with slight decrease in active ROM, painful and decreased strength in most shoulder ms groups, positive neer's and hawkin's test on left and ms guarding in upper traps and pecs. Patient having difficulty with work and adl's involving lifting over head    Clinical Findings:   Functional Status: able to work but has pain using left arm, sharp pain with movement  Neurological: intact  Pain: 3-8  ROM/Strength: above  Special tests: above  Girth: wnl  Other:   Patient would benefit from skilled PT to address above issues.      Chief complaint of left shoulder pain, decreased ROM, decreased [id number]}  Patient will benefit from skilled PT to address the rehabilitation goals outlined below.    Rehabilitation Goals  Pt. will demonstrate normalized left shoulder ROM.  Duration: 2 weeks  Pt. will demonstrate increased left UE strength by 1/2 grade throughout.   Duration: 8 weeks  Pt. will report ability to perform all work duties pain free.   Duration: 8 weeks  Pt. will demonstrate good postural awareness with ability to self correct  with minimal throughout.   Duration: 4 weeks  Average shoulder pain will be reduced from a 5/10 to a 0/10. Duration: 8 weeks  Patient to be Independent with Home Exercise Program.  Duration: 2 weeks     Patient will benefit from skilled PT to address the rehabilitation goals outlined below.    Treatment Plan: ** Stretching/ROM Exercise  ** Therapeutic Exercise  ** Home Exercise Program  ** Joint Mobilization  ** Soft Tissue Mobilization  ** Ultrasound  ** Electrical  Stimulation/TENS  ** Hot/Cold Rx  ** Functional Activities  ** Patient Education  Long Term Goal: Max recovery from left shoulder pain, ? rtc tear, return to normal work/school/leisure activities with min pain.       Recommend Physical Therapy be continued 1-2 times per week for 8 weeks.  The rehabilitation potential for this patient is good    Patient is agreeable to treatment plan and is aware of attendance policy.    Lubertha Sayres, PT

## 2012-03-09 NOTE — Progress Notes (Addendum)
I certify that the documented Treatment Plan is reasonable and necessary.    03/09/2012  Greer Ee, MD

## 2012-03-16 ENCOUNTER — Ambulatory Visit (HOSPITAL_BASED_OUTPATIENT_CLINIC_OR_DEPARTMENT_OTHER): Payer: PRIVATE HEALTH INSURANCE | Admitting: Rehabilitative and Restorative Service Providers"

## 2012-03-22 ENCOUNTER — Ambulatory Visit (HOSPITAL_BASED_OUTPATIENT_CLINIC_OR_DEPARTMENT_OTHER): Payer: PRIVATE HEALTH INSURANCE | Admitting: Physical Therapy

## 2012-03-22 DIAGNOSIS — M7582 Other shoulder lesions, left shoulder: Principal | ICD-10-CM

## 2012-03-22 NOTE — Patient Instructions (Addendum)
Rehabilitation Treatment Flowsheet    Precautions:  51 yo male with left shoulder pain, ?rtc tear or tendonopathy. Need scap stab, ROM and pain control  Date: 03/08/12 03/22/12        Initials: ltm SO        Visit #: 1 2        POC Due Date:          Time: 45 35 min        HEP          Treatment(s) 3-8/10 4/10               IE MET's flex/ext with 3.5# 2x20               Pt ed MET's abd/add 3.5# 2x20               HEP including aa shoulder flexion, IR and ER, red TB to shoulder deloaded flexion, abd and rows MET'sER/IR with 4# 2x20               Instruction in posture and pain control MET's rows 5# 2x20                supine horizontal abd with RTB 3x10                serratus punches with 3# stick 3x10

## 2012-03-22 NOTE — Progress Notes (Signed)
.    S: Pt report L shld pain increase with active movement to 8/10. Also states HEP is helping to decrease his pain level. Pain level today before session 4/10.  O: Refer to Rehabilitation Treatment Flowsheet  A: Initiated with MET's ex's program and scapular stabilization ex's. Pt able to performed all ex's with min c/o of pain at end of the RX, which decrease after rest. Also require min verbal cues for a proper ex's perform. Educ pt to cont with HEP. Pt verbalized understanding.  P: Cont with  PT per POC.

## 2012-03-23 ENCOUNTER — Ambulatory Visit (HOSPITAL_BASED_OUTPATIENT_CLINIC_OR_DEPARTMENT_OTHER): Payer: PRIVATE HEALTH INSURANCE | Admitting: Nurse Practitioner

## 2012-03-29 ENCOUNTER — Ambulatory Visit (HOSPITAL_BASED_OUTPATIENT_CLINIC_OR_DEPARTMENT_OTHER): Payer: PRIVATE HEALTH INSURANCE | Admitting: Physical Therapy

## 2012-04-05 ENCOUNTER — Ambulatory Visit (HOSPITAL_BASED_OUTPATIENT_CLINIC_OR_DEPARTMENT_OTHER): Payer: PRIVATE HEALTH INSURANCE | Admitting: Rehabilitative and Restorative Service Providers"

## 2012-08-23 ENCOUNTER — Ambulatory Visit (HOSPITAL_BASED_OUTPATIENT_CLINIC_OR_DEPARTMENT_OTHER): Payer: PRIVATE HEALTH INSURANCE | Admitting: Internal Medicine

## 2012-09-13 ENCOUNTER — Encounter (HOSPITAL_BASED_OUTPATIENT_CLINIC_OR_DEPARTMENT_OTHER): Payer: Self-pay | Admitting: Internal Medicine

## 2012-09-13 ENCOUNTER — Ambulatory Visit (HOSPITAL_BASED_OUTPATIENT_CLINIC_OR_DEPARTMENT_OTHER): Payer: PRIVATE HEALTH INSURANCE | Admitting: Internal Medicine

## 2012-09-13 VITALS — BP 100/80 | HR 60 | Temp 97.9°F | Wt 164.0 lb

## 2012-09-13 DIAGNOSIS — E663 Overweight: Secondary | ICD-10-CM

## 2012-09-13 DIAGNOSIS — I10 Essential (primary) hypertension: Principal | ICD-10-CM

## 2012-09-13 DIAGNOSIS — Z23 Encounter for immunization: Secondary | ICD-10-CM

## 2012-09-13 LAB — POTASSIUM: POTASSIUM: 3.9 mmol/L (ref 3.5–5.1)

## 2012-09-13 LAB — CHG CREATININE BLOOD: CREATININE: 1 mg/dl (ref 0.7–1.2)

## 2012-09-13 LAB — BUN (UREA NITROGEN): BUN (UREA NITROGEN): 15 mg/dl (ref 6–20)

## 2012-09-13 NOTE — Progress Notes (Signed)
09/13/2012  VIS given prior to administration and reviewed with the patient and or legal guardian. Patient understands the disease and the vaccine. See immunization/Injection module or chart review for date of publication and additional information.  Lossie Kalp, RN

## 2012-09-13 NOTE — Addendum Note (Signed)
Addended by: Windell Moulding on: 09/13/2012 09:59 AM     Modules accepted: Orders, SmartSet

## 2012-09-13 NOTE — Progress Notes (Signed)
HTN:  See EPIC Problem List for HPI.  He reports taking the hypertension medications as on his EPIC med list below.  He reports no difficulty with compliance or side-effects  He denies chest pain, dyspnea, headache, lower extremity edema or localized weakness or paresthesias  His last three blood pressures in clinic were:  Most Recent BP Reading(s)  09/13/12 : 100/80  02/23/12 : 142/74  09/02/11 : 129/71  His Most Recent Weight Reading(s)  09/13/12 : 164 lb (74.39 kg)  02/23/12 : 162 lb (73.483 kg)  09/02/11 : 161 lb 13.1 oz (73.4 kg)  09/01/11 : 162 lb (73.483 kg)    last potassium was   POTASSIUM (mmol/L)   Date  Value    09/02/2011  3.8    ----------  last creatinine was   CREATININE (mg/dl)   Date  Value    16/08/9603  1.0    ----------     ROS: No fevers or unexplained weight loss. No new headaches. No shortness of breath or chest pain.     BP 100/80   Pulse 60   Temp(Src) 97.9 F (36.6 C)   Wt 164 lb (74.39 kg)   BMI 26.08 kg/m2   SpO2 98%  Heart: S1 and S2 normal, no murmurs, clicks, gallops or rubs. Regular rate and rhythm.   Lungs:  clear; no wheezes, rhonchi or rales.    ASSESSMENT & PLAN:  (401.1) Essential hypertension, benign  Comment: altho' BP on the low end today he's not having any hypotensive symptoms -- has been elevated in the recent past, so for now will continue same regimen  Plan: POTASSIUM, BUN (UREA NITROGEN), CREATININE         BLOOD, COLLECTION VENOUS BLOOD VENIPUNCTURE            (278.02) Overweight  Comment: encouraged modest 1 kg weight loss at this point        follow-up will be scheduled for 6 months from now  he has been advised to call or return with any worsening or new problems

## 2012-09-25 ENCOUNTER — Encounter (HOSPITAL_BASED_OUTPATIENT_CLINIC_OR_DEPARTMENT_OTHER): Payer: Self-pay | Admitting: Internal Medicine

## 2012-09-25 ENCOUNTER — Ambulatory Visit (HOSPITAL_BASED_OUTPATIENT_CLINIC_OR_DEPARTMENT_OTHER): Payer: PRIVATE HEALTH INSURANCE | Admitting: Internal Medicine

## 2012-09-25 VITALS — BP 124/74 | HR 78 | Temp 98.4°F | Wt 163.0 lb

## 2012-09-25 DIAGNOSIS — F322 Major depressive disorder, single episode, severe without psychotic features: Principal | ICD-10-CM

## 2012-09-25 MED ORDER — CITALOPRAM HYDROBROMIDE 20 MG PO TABS
20.00 mg | ORAL_TABLET | Freq: Every day | ORAL | Status: AC
Start: 2012-09-25 — End: 2013-03-24

## 2012-09-25 NOTE — Progress Notes (Signed)
Cc: depression  For the past year, he has been having a very difficult time  5 months ago separated from his wife  He been having trouble with low energy, insomnia, poor concentration, feeling down & lack of motivation on all days of the week  Can't stop worrying about his family & his daughter  He denies thoughts of death, suicidal thoughts or suicide attempts     BP 124/74  Pulse 78  Temp(Src) 98.4 F (36.9 C)  Wt 163 lb (73.936 kg)  BMI 25.92 kg/m2  SpO2 99%  MMSE: well groomed, affect blunted, no tangential thoughts, no pressured speech, no agitation, + psychomotor slowing, normal response-time to questions  Neuro: alert & oriented x 3, no dysarthria or aphasia, gait wnl      ASSESSMENT & PLAN:  (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  Although organic causes of these symptoms such as cancer, stroke, demyelinating disease, Cushing disease, B12 def and epilepsy were considered, these potential diagnoses are so remote given the clinical setting that I will not pursue them further.    Given the diagnosis of severe major depressive episode he is a candidate for either medication and therapy as treatment options.  These two approaches have been discussed in detail.  He has decided to start citalopram as well as continue to see Sherlene Shams.   see EPIC orders for prescription details  counseled on slow onset of action -- likely 2-3 weeks until start of effect and likely 4-6 weeks until full effect  reviewed side profile including both somatic (HA's, abd pains, sexual dysfunction) & psychologic (insomnia,  increase in anxiety and/or depression)   additional details of how to take the medicine were given in written patient education handout today  f/u scheduled as below, but patient aware he should call if he has any questions prior to this appointment  he also understands to stop medicine if he has suicidal ideation and to call clinic immediately       follow-up will be scheduled for 3 weeks from now  he has been advised to call or return with any worsening or new problems   face-to-face 25 minutes with > 50% of the visit counseling regarding above issues

## 2012-09-27 ENCOUNTER — Ambulatory Visit (HOSPITAL_BASED_OUTPATIENT_CLINIC_OR_DEPARTMENT_OTHER): Payer: PRIVATE HEALTH INSURANCE

## 2012-10-06 ENCOUNTER — Telehealth (HOSPITAL_BASED_OUTPATIENT_CLINIC_OR_DEPARTMENT_OTHER): Payer: Self-pay | Admitting: Internal Medicine

## 2012-10-06 NOTE — Progress Notes (Signed)
Left vm for patient to call clinic to reschedule apt for 10/16/12

## 2012-10-11 ENCOUNTER — Ambulatory Visit (HOSPITAL_BASED_OUTPATIENT_CLINIC_OR_DEPARTMENT_OTHER): Payer: PRIVATE HEALTH INSURANCE | Admitting: Ophthalmology

## 2012-10-16 ENCOUNTER — Ambulatory Visit (HOSPITAL_BASED_OUTPATIENT_CLINIC_OR_DEPARTMENT_OTHER): Payer: PRIVATE HEALTH INSURANCE | Admitting: Internal Medicine

## 2012-11-08 ENCOUNTER — Ambulatory Visit (HOSPITAL_BASED_OUTPATIENT_CLINIC_OR_DEPARTMENT_OTHER): Payer: PRIVATE HEALTH INSURANCE

## 2013-03-07 ENCOUNTER — Ambulatory Visit (HOSPITAL_BASED_OUTPATIENT_CLINIC_OR_DEPARTMENT_OTHER): Payer: PRIVATE HEALTH INSURANCE | Admitting: Internal Medicine

## 2013-03-14 ENCOUNTER — Ambulatory Visit (HOSPITAL_BASED_OUTPATIENT_CLINIC_OR_DEPARTMENT_OTHER): Payer: PRIVATE HEALTH INSURANCE | Admitting: Internal Medicine

## 2013-12-14 ENCOUNTER — Other Ambulatory Visit (HOSPITAL_BASED_OUTPATIENT_CLINIC_OR_DEPARTMENT_OTHER): Payer: Self-pay

## 2013-12-14 NOTE — Telephone Encounter (Signed)
Patient is due for Complete Physical, IFOB, Care Plan. Called and left a message for patient to call and schedule appointment with PCP. Also sent letter.

## 2014-01-28 ENCOUNTER — Telehealth (HOSPITAL_BASED_OUTPATIENT_CLINIC_OR_DEPARTMENT_OTHER): Payer: Self-pay | Admitting: Internal Medicine

## 2014-01-28 NOTE — Progress Notes (Addendum)
Outreach for PE, IFOB, Health Maintenance. Called and phone # listed [phone] belongs to another person. Letter was sent in 12/14/13.

## 2021-01-29 ENCOUNTER — Encounter (HOSPITAL_BASED_OUTPATIENT_CLINIC_OR_DEPARTMENT_OTHER): Payer: Self-pay

## 2022-08-05 IMAGING — MR OMBRO DIR
5 of 6 series · 29 of 40 positions shown · non-contrast
Comparison: none

[Series 1: loc dir · axial · 5.0mm · 0.78mm/px · z∈[-20,+100]mm · 8 of 27 slices shown (1 of 2)]
[im 1/27]
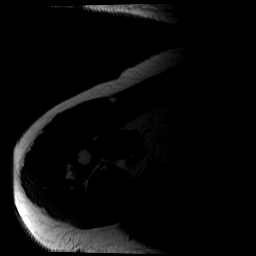
[im 4/27]
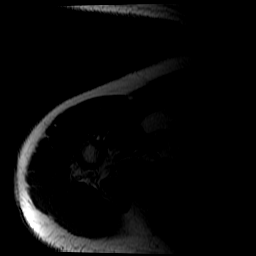
[im 8/27]
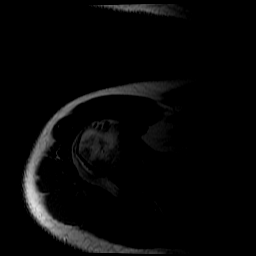
[im 12/27]
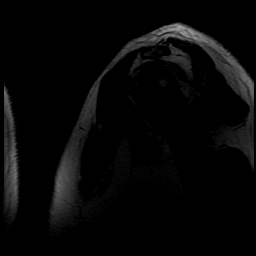
[im 15/27]
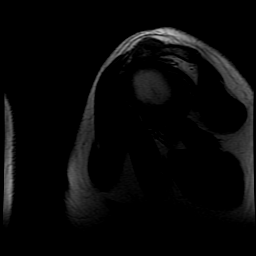
[im 19/27]
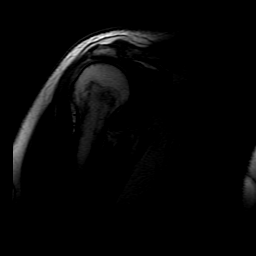
[im 23/27]
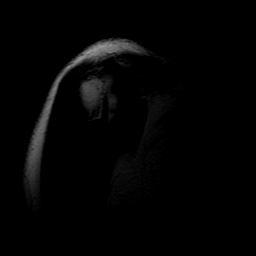
[im 27/27]
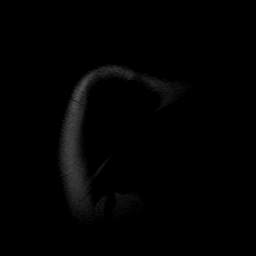

[Series 3: loc dir · axial · 5.0mm · 0.78mm/px · z∈[+4,+123]mm · 8 of 27 slices shown (2 of 2)]
[im 1/27]
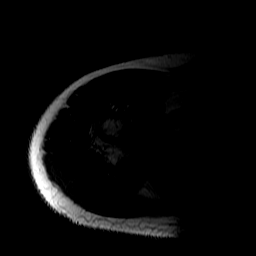
[im 4/27]
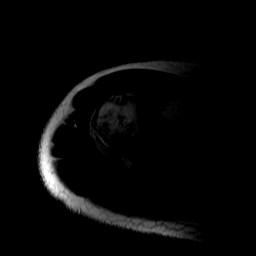
[im 8/27]
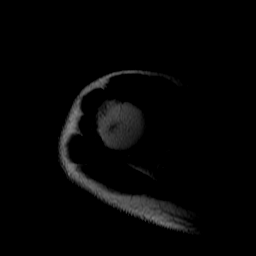
[im 12/27]
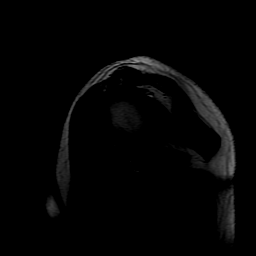
[im 15/27]
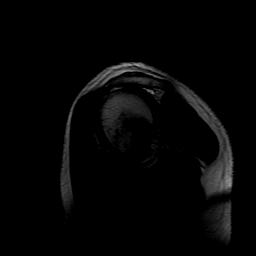
[im 19/27]
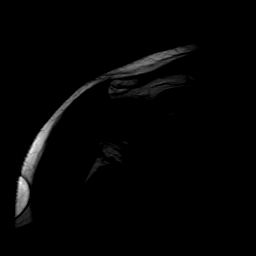
[im 23/27]
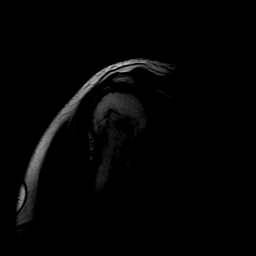
[im 27/27]
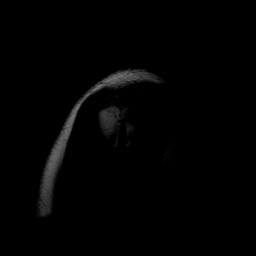

[Series 4: cor cp · oblique · 3.0mm · 0.39mm/px · 6 of 20 slices shown]
[im 1/20]
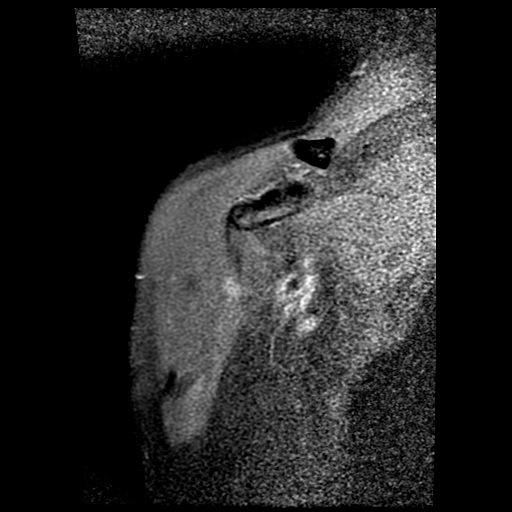
[im 4/20]
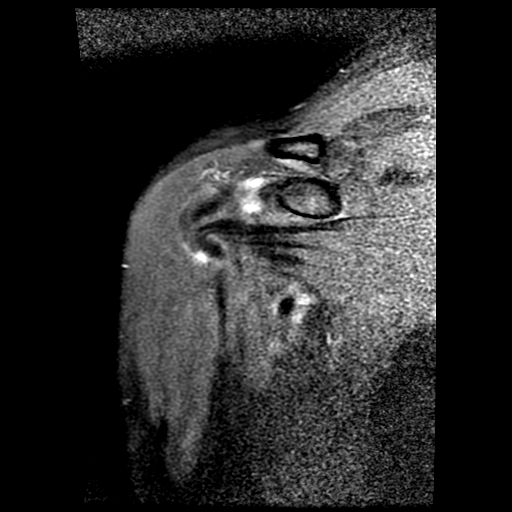
[im 8/20]
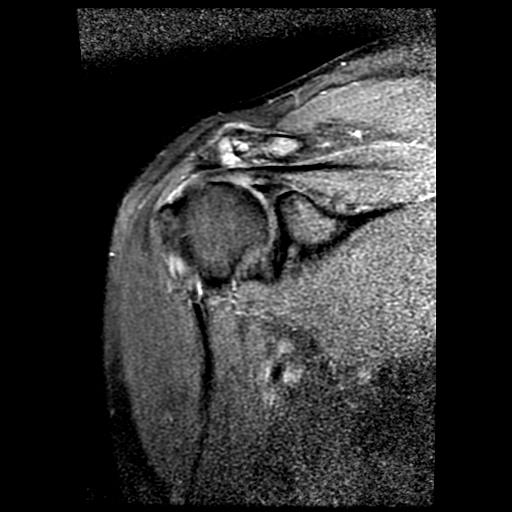
[im 12/20]
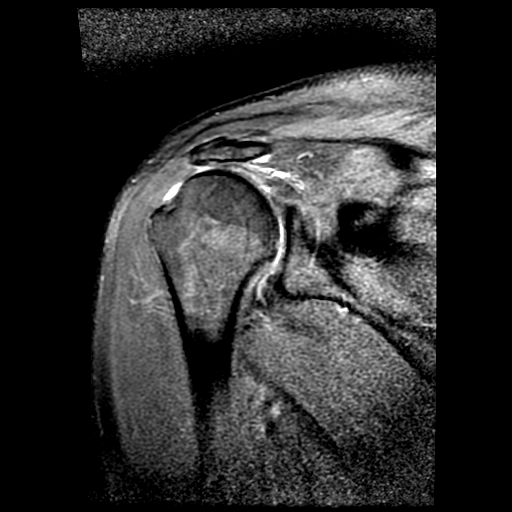
[im 16/20]
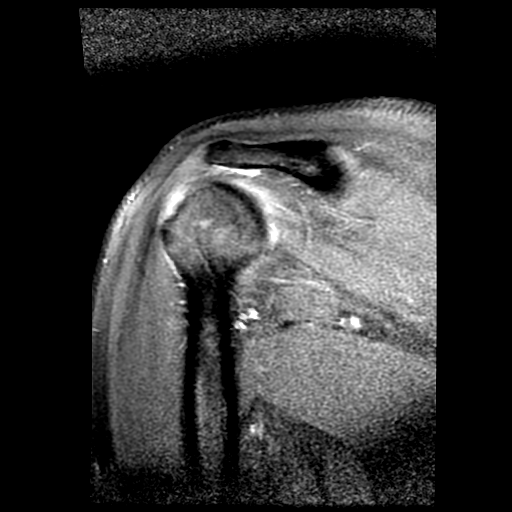
[im 20/20]
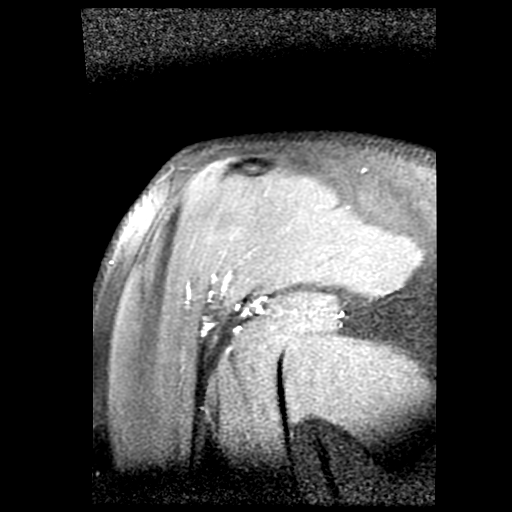

[Series 5: T2 · oblique · 3.0mm · 0.39mm/px · 6 of 20 slices shown]
[im 1/20]
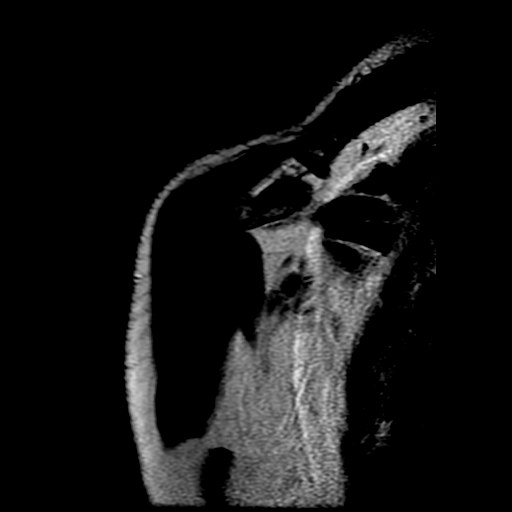
[im 4/20]
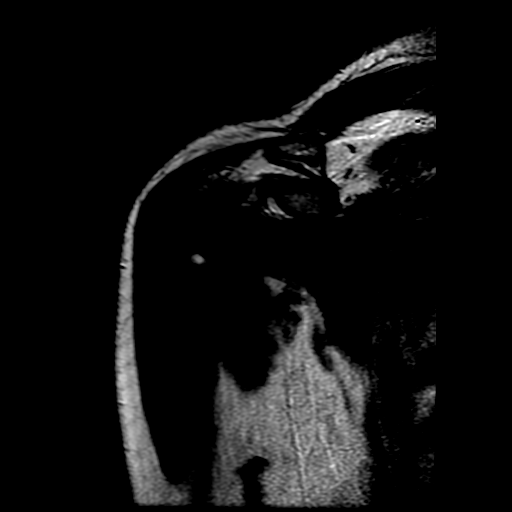
[im 8/20]
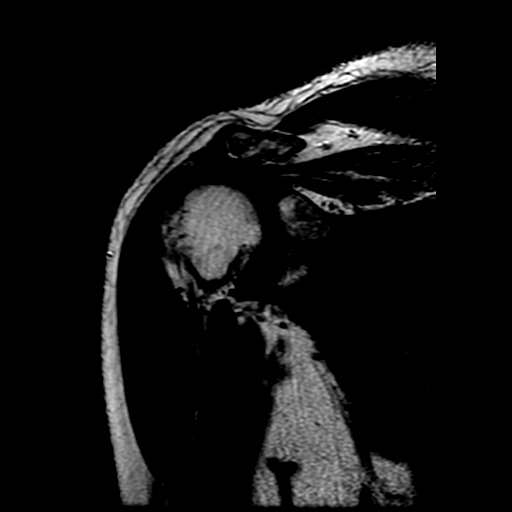
[im 12/20]
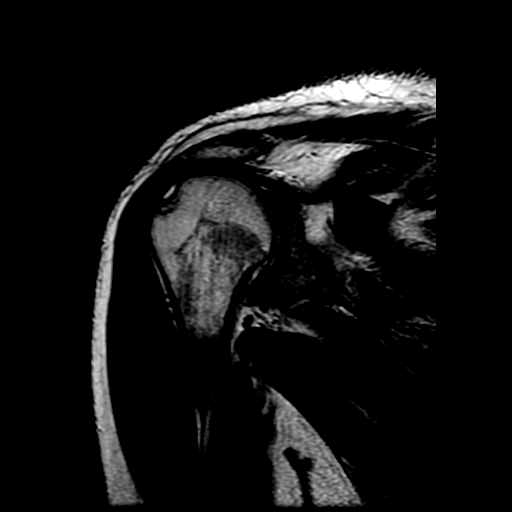
[im 16/20]
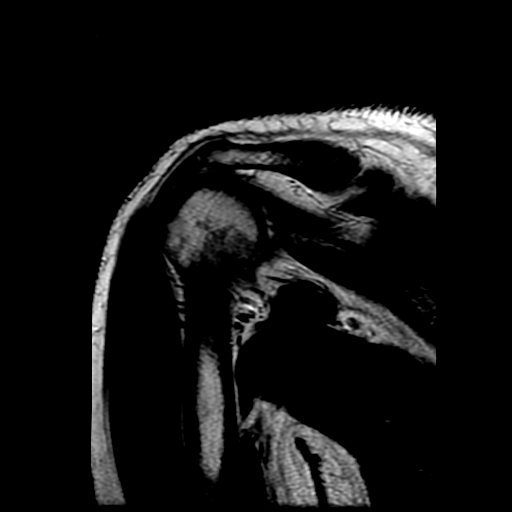
[im 20/20]
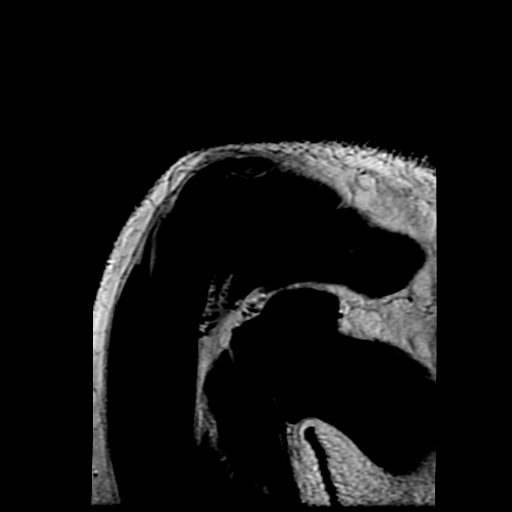

[Series 6: cor dp fs · oblique · 3.0mm · 0.39mm/px · 1 of 20 slices shown]
[im 1/20]
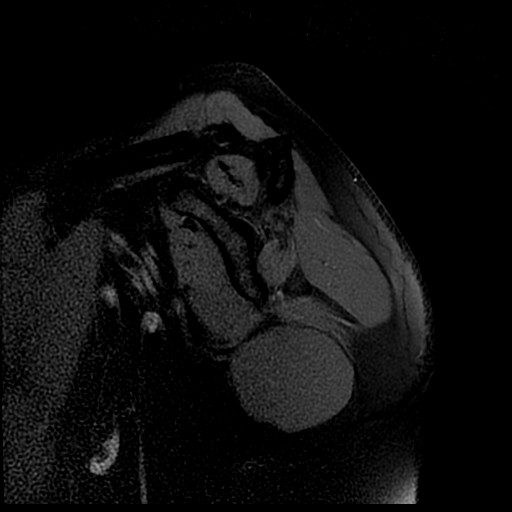

[29 of 40 positions shown; findings below may reference images not displayed]

Técnica:
Exame realizado com sequências multiplanares ponderadas em T1, T2/DP e T2 com supressão de gordura.
Relatório:
Rotura de espessura total comprometendo toda extensão ântero-posterior do tendão do supra-espinhal e as
fibras do terço superior do tendão do infra-espinhal, medindo cerca de 1,9 cm no sentido transverso, com
retração longitudinal doNORAFIAH ROYANIcoto tendíneo que apresenta extremidade localizada no nível da articulação acrômio-
RESSONÂNCIA MAGNÉTICA DO OMBRO DIREITO
clavicular.
Moderada / acentuada tendinopatia do subescapular junto à sua inserção determinada por espessamento,
alteração de sinal e fissuras longitudinais intra-substanciais.
Tendinopatia do cabo longo do bíceps em seu segmento intra-articular, onde o tendão apresenta-se
acentuadamente espessado com alteração de sinal, sem roturas.
Tendão do redondo menor sem alterações.
Hipotrofia e lipossubstituição dos ventres musculares do manguito rotador, grau I/II do supra-espinhal e
infra-espinhal. Há também atrofia e lipossubstituição grau IV do ventre do redondo menor, achado que pode
estar relacionada a denervação crônica.
Alteração degenerativa difusa do lábio da glenóide, com fissura na base implantação de seu segmento
anterior.
Ausência de derrame articular.
Cabeça umeral migrada superiormente, determinando redução do espaço acrômio-umeral.
Irregularidade na cortical da porção súpero-lateral da tuberosidade maior do úmero, provavelmente
relacionado a contato / impacto com a borda lateral do acrômio.
Acentuada artrose acrômio-clavicular.
Demais estruturas periarticulares sem anormalidades.
Impressão:
Rotura de espessura total comprometendo toda extensão ântero-posterior do tendão do supra-espinhal e as
fibras do terço superior do tendão do infra-espinhal.
Moderada / acentuada tendinopatia do subescapular.
Tendinopatia do cabo longo do bíceps em seu segmento intra-articular.
Hipotrofia e lipossubstituição dos ventres musculares do manguito rotador, grau I/II do supra-espinhal e
infra-espinhal. Há também atrofia e lipossubstituição do ventre do redondo menor, achado que pode estar
relacionada a denervação crônica.
Alteração degenerativa difusa do lábio da glenóide.
Cabeça umeral migrada superiormente, determinando redução do espaço acrômio-umeral. Há irregularidade
na cortical da porção súpero-lateral da tuberosidade maior do úmero, provavelmente relacionado a contato /
impacto com a borda lateral do acrômio.
Acentuada artrose acrômio-clavicular.

## 2022-08-05 IMAGING — MR OMBRO ESQ
6 of 8 series · 31 of 40 positions shown · non-contrast
Comparison: none

[Series 1: loc esq · axial · 5.0mm · 0.78mm/px · z∈[-20,+100]mm · 6 of 27 slices shown (1 of 3)]
[im 1/27]
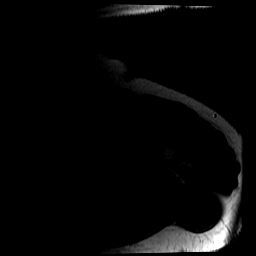
[im 6/27]
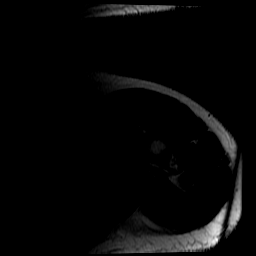
[im 11/27]
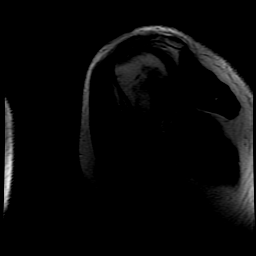
[im 16/27]
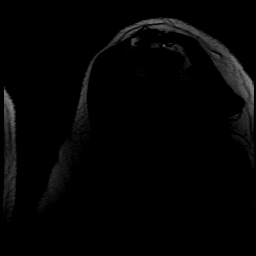
[im 21/27]
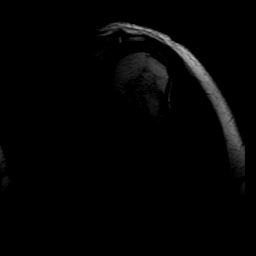
[im 27/27]
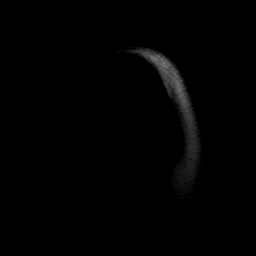

[Series 3: loc esq · axial · 5.0mm · 0.78mm/px · z∈[+13,+132]mm · 6 of 27 slices shown (2 of 3)]
[im 1/27]
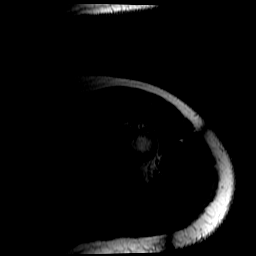
[im 6/27]
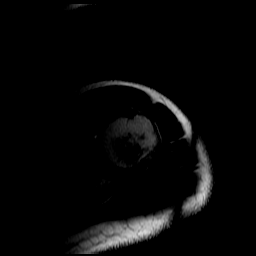
[im 11/27]
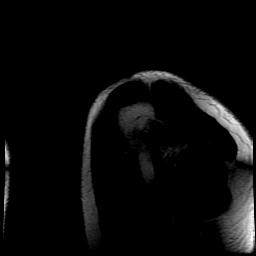
[im 16/27]
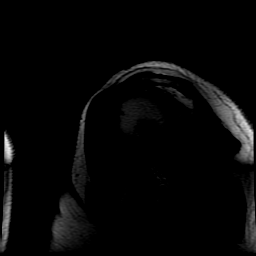
[im 21/27]
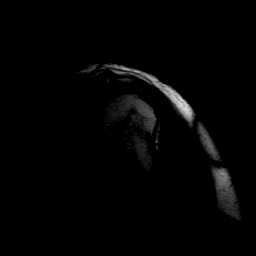
[im 27/27]
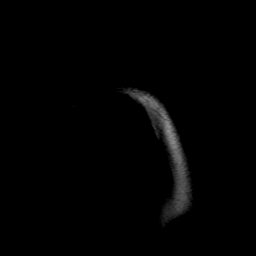

[Series 4: loc esq · axial · 5.0mm · 0.78mm/px · z∈[+13,+132]mm · 6 of 27 slices shown (3 of 3)]
[im 1/27]
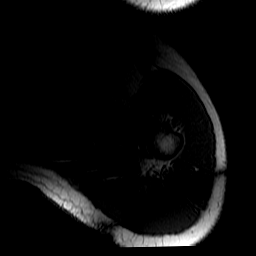
[im 6/27]
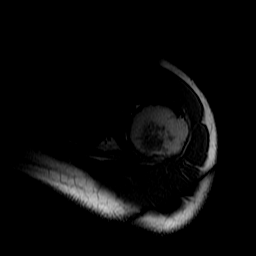
[im 11/27]
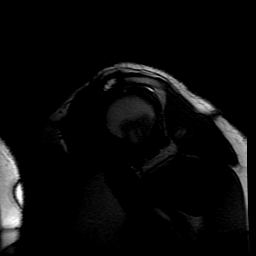
[im 16/27]
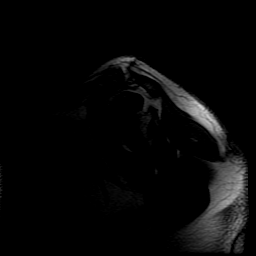
[im 21/27]
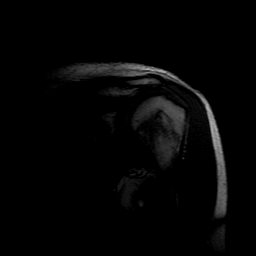
[im 27/27]
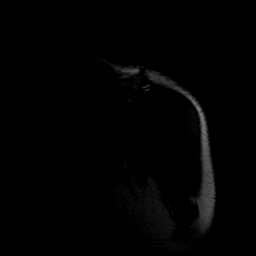

[Series 5: cor dp fs · oblique · 3.0mm · 0.39mm/px · 4 of 16 slices shown]
[im 1/16]
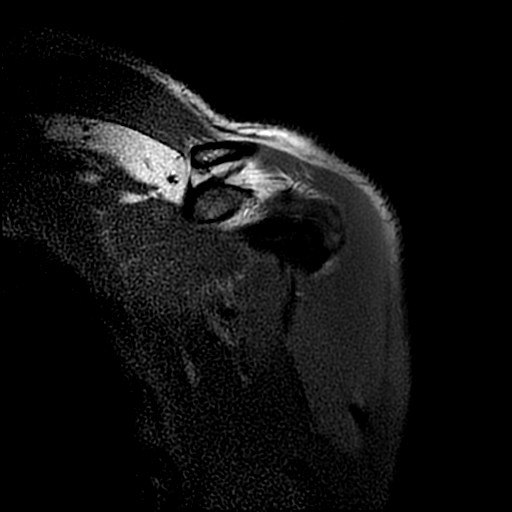
[im 6/16]
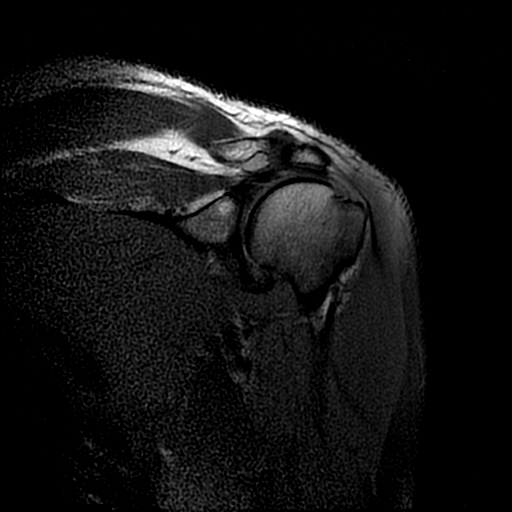
[im 11/16]
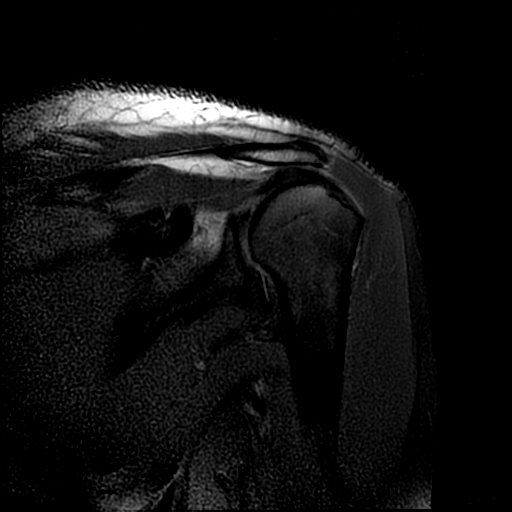
[im 16/16]
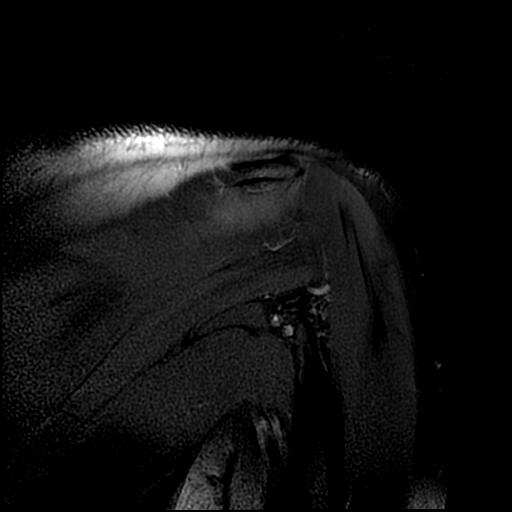

[Series 6: T2 fat-sat · oblique · 3.0mm · 0.39mm/px · 4 of 16 slices shown]
[im 1/16]
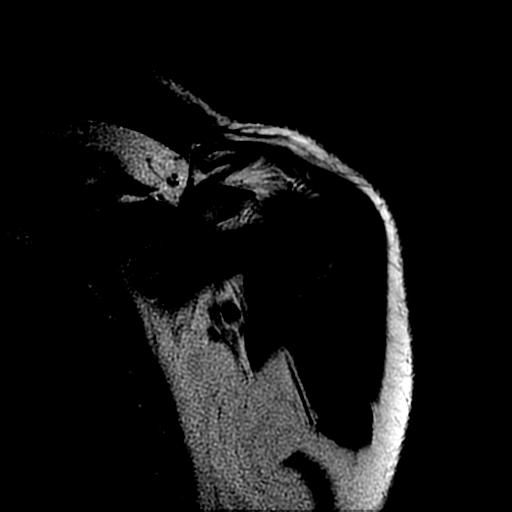
[im 6/16]
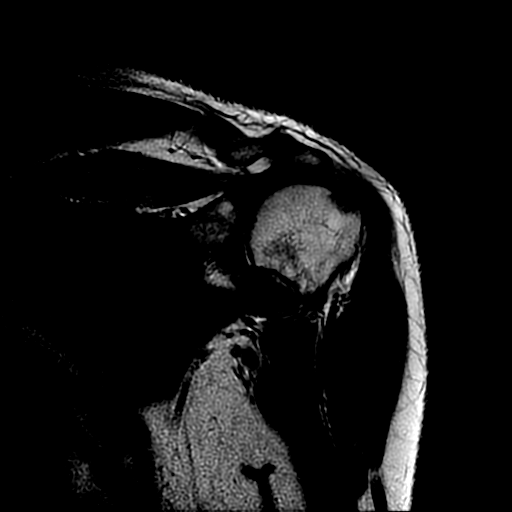
[im 11/16]
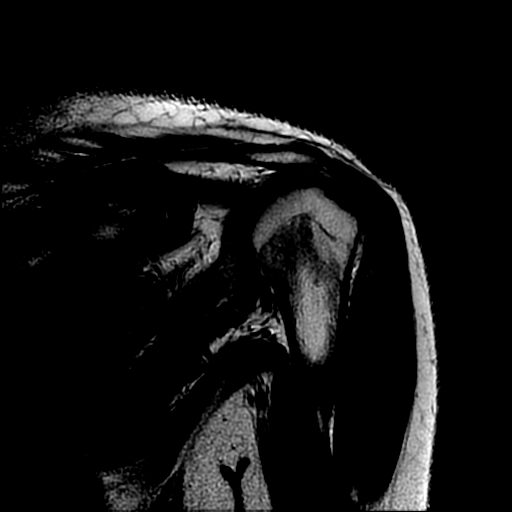
[im 16/16]
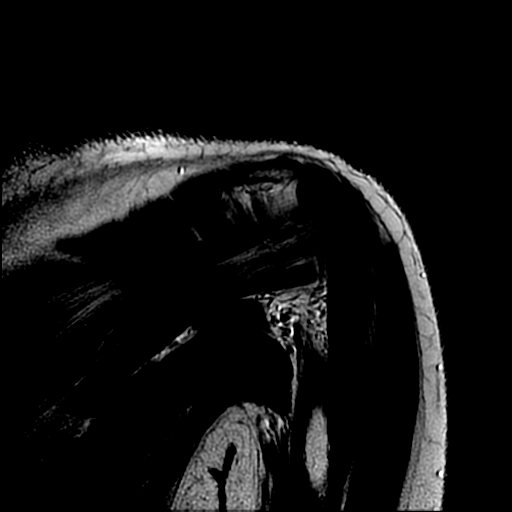

[Series 7: sag dp fs · oblique · 3.0mm · 0.39mm/px · 5 of 20 slices shown]
[im 1/20]
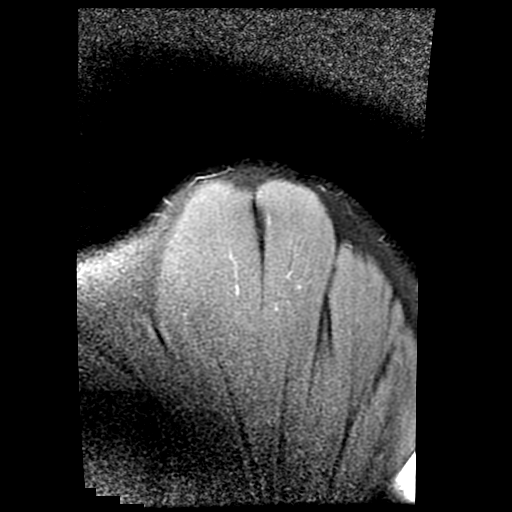
[im 5/20]
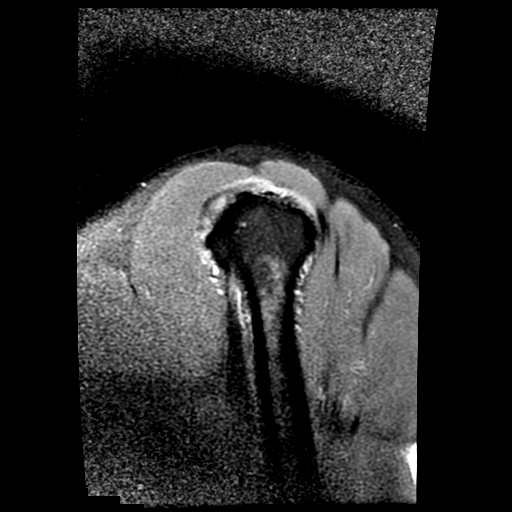
[im 10/20]
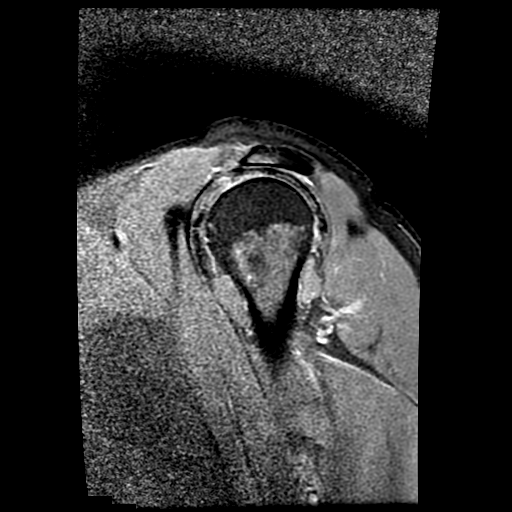
[im 15/20]
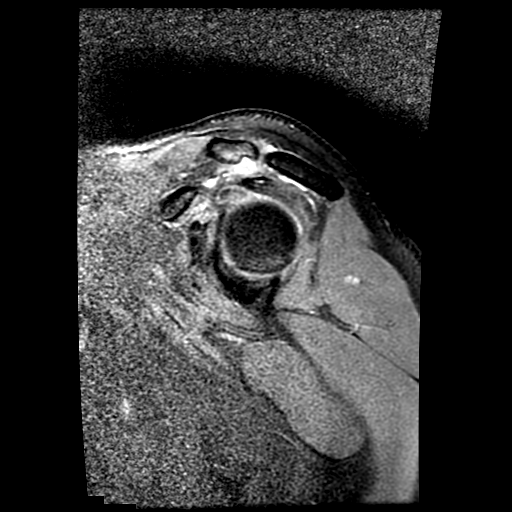
[im 20/20]
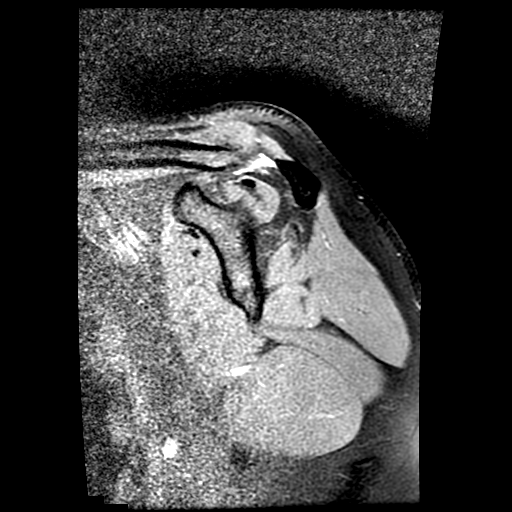

[31 of 40 positions shown; findings below may reference images not displayed]

Técnica:
Exame realizado com sequências multiplanares ponderadas em T1, T2/DP e T2 com supressão de gordura.
Relatório:
Rotura de espessura total comprometendo as fibras dos terços médio e posterior do tendão supra-espinhal,
medindo 11 mm no sentido transverso, com retração longitudinal doJOSSLINE MANOcoto tendíneo, que apresenta
RESSONÂNCIA MAGNÉTICA DO OMBRO ESQUERDO
extremidade localizada no nível da articulação acrômio-clavicular.
Acentuada tendinopatia do infra-espinhal junto à sua inserção determinada por espessamento alteração de
sinal intra-substancial, sem roturas.
Hipotrofia e discretos focos de lipossubstituição comprometendo os ventres do supra-espinhal e infra-
espinhal.
Os demais tendões que compõem o manguito rotador não apresentam alterações.
Ausência de sinais inflamatórios da bursa subacromial / subdeltóidea.
Tendinopatia do cabo longo do bíceps em seu segmento intra-articular, determinada por acentuado
espessamento e alteração de sinal, sem roturas.
Ventres musculares eutróficos e sem anormalidades.
Alteração degenerativa difusa do lábio da glenóide.
Ausência de derrame articular ou sinovite.
Revestimentos condrais da articulação glenumeral com espessura mantida e sem anormalidades.
Osteoartrose acrômio-clavicular.
Demais estruturas ósseas sem alterações.
Impressão:
Rotura de espessura total comprometendo as fibras dos terços médio e posterior do tendão supra-espinhal.
Acentuada tendinopatia do infra-espinhal junto à sua inserção.
Hipotrofia e discretos focos de lipossubstituição comprometendo os ventres do supra-espinhal e infra-
espinhal.
Tendinopatia do cabo longo do bíceps em seu segmento intra-articular, determinada por acentuado
espessamento e alteração de sinal, sem roturas.
Alteração degenerativa difusa do lábio da glenóide.
Osteoartrose acrômio-clavicular.
